# Patient Record
Sex: Female | Born: 1965 | Race: White | Hispanic: No | Marital: Single | Smoking: Never smoker
Health system: Southern US, Community
[De-identification: ages and names within clinical notes are randomized; demographics above are authoritative.]

## PROBLEM LIST (undated history)

## (undated) DIAGNOSIS — Z860100 Personal history of colon polyps, unspecified: Secondary | ICD-10-CM

## (undated) DIAGNOSIS — C569 Malignant neoplasm of unspecified ovary: Secondary | ICD-10-CM

## (undated) DIAGNOSIS — I10 Essential (primary) hypertension: Secondary | ICD-10-CM

## (undated) HISTORY — DX: Essential (primary) hypertension: I10

## (undated) HISTORY — PX: OTHER SURGICAL HISTORY: SHX169

## (undated) HISTORY — DX: Personal history of colonic polyps: Z86.010

## (undated) HISTORY — DX: Personal history of colon polyps, unspecified: Z86.0100

---

## 1993-08-15 DIAGNOSIS — C569 Malignant neoplasm of unspecified ovary: Secondary | ICD-10-CM

## 1993-08-15 HISTORY — DX: Malignant neoplasm of unspecified ovary: C56.9

## 1993-08-15 HISTORY — PX: ABDOMINAL HYSTERECTOMY: SHX81

## 2005-01-19 ENCOUNTER — Ambulatory Visit: Payer: Self-pay | Admitting: Oncology

## 2005-02-12 ENCOUNTER — Ambulatory Visit: Payer: Self-pay | Admitting: Oncology

## 2005-03-23 ENCOUNTER — Ambulatory Visit: Payer: Self-pay | Admitting: Oncology

## 2005-07-20 ENCOUNTER — Ambulatory Visit: Payer: Self-pay | Admitting: Oncology

## 2005-08-15 ENCOUNTER — Ambulatory Visit: Payer: Self-pay | Admitting: Oncology

## 2006-01-19 ENCOUNTER — Ambulatory Visit: Payer: Self-pay | Admitting: Oncology

## 2006-02-12 ENCOUNTER — Ambulatory Visit: Payer: Self-pay | Admitting: Oncology

## 2006-03-15 ENCOUNTER — Ambulatory Visit: Payer: Self-pay | Admitting: Oncology

## 2006-07-19 ENCOUNTER — Ambulatory Visit: Payer: Self-pay | Admitting: Oncology

## 2007-01-14 ENCOUNTER — Ambulatory Visit: Payer: Self-pay | Admitting: Oncology

## 2007-01-18 ENCOUNTER — Ambulatory Visit: Payer: Self-pay | Admitting: Oncology

## 2007-02-13 ENCOUNTER — Ambulatory Visit: Payer: Self-pay | Admitting: Oncology

## 2007-03-16 ENCOUNTER — Ambulatory Visit: Payer: Self-pay | Admitting: Oncology

## 2007-07-17 ENCOUNTER — Ambulatory Visit: Payer: Self-pay | Admitting: Oncology

## 2008-01-14 ENCOUNTER — Ambulatory Visit: Payer: Self-pay | Admitting: Oncology

## 2008-01-15 ENCOUNTER — Ambulatory Visit: Payer: Self-pay | Admitting: Oncology

## 2008-02-13 ENCOUNTER — Ambulatory Visit: Payer: Self-pay | Admitting: Oncology

## 2008-03-15 ENCOUNTER — Ambulatory Visit: Payer: Self-pay | Admitting: Oncology

## 2008-04-11 ENCOUNTER — Ambulatory Visit: Payer: Self-pay | Admitting: Gastroenterology

## 2008-07-15 ENCOUNTER — Ambulatory Visit: Payer: Self-pay | Admitting: Gynecologic Oncology

## 2008-07-15 ENCOUNTER — Ambulatory Visit: Payer: Self-pay | Admitting: Oncology

## 2009-01-13 ENCOUNTER — Ambulatory Visit: Payer: Self-pay | Admitting: Oncology

## 2009-01-15 ENCOUNTER — Ambulatory Visit: Payer: Self-pay | Admitting: Oncology

## 2009-02-12 ENCOUNTER — Ambulatory Visit: Payer: Self-pay | Admitting: Oncology

## 2009-03-15 ENCOUNTER — Ambulatory Visit: Payer: Self-pay | Admitting: Oncology

## 2009-07-15 ENCOUNTER — Ambulatory Visit: Payer: Self-pay | Admitting: Oncology

## 2009-07-28 ENCOUNTER — Ambulatory Visit: Payer: Self-pay | Admitting: Oncology

## 2009-08-15 ENCOUNTER — Ambulatory Visit: Payer: Self-pay | Admitting: Oncology

## 2010-01-13 ENCOUNTER — Ambulatory Visit: Payer: Self-pay | Admitting: Oncology

## 2010-01-14 ENCOUNTER — Ambulatory Visit: Payer: Self-pay | Admitting: Oncology

## 2010-02-12 ENCOUNTER — Ambulatory Visit: Payer: Self-pay | Admitting: Oncology

## 2010-03-15 ENCOUNTER — Ambulatory Visit: Payer: Self-pay | Admitting: Oncology

## 2010-07-27 ENCOUNTER — Ambulatory Visit: Payer: Self-pay | Admitting: Gynecologic Oncology

## 2010-08-15 ENCOUNTER — Ambulatory Visit: Payer: Self-pay | Admitting: Gynecologic Oncology

## 2011-01-21 ENCOUNTER — Ambulatory Visit: Payer: Self-pay | Admitting: Oncology

## 2011-02-13 ENCOUNTER — Ambulatory Visit: Payer: Self-pay | Admitting: Oncology

## 2011-03-16 ENCOUNTER — Ambulatory Visit: Payer: Self-pay | Admitting: Oncology

## 2011-08-02 ENCOUNTER — Ambulatory Visit: Payer: Self-pay | Admitting: Oncology

## 2011-08-16 ENCOUNTER — Ambulatory Visit: Payer: Self-pay | Admitting: Oncology

## 2012-05-10 ENCOUNTER — Ambulatory Visit: Payer: Self-pay | Admitting: Internal Medicine

## 2012-08-14 ENCOUNTER — Ambulatory Visit: Payer: Self-pay | Admitting: Gynecologic Oncology

## 2012-08-15 ENCOUNTER — Ambulatory Visit: Payer: Self-pay | Admitting: Gynecologic Oncology

## 2012-08-16 LAB — CA 125: CA 125: 16.3 U/mL (ref 0.0–34.0)

## 2013-05-10 ENCOUNTER — Ambulatory Visit: Payer: Self-pay | Admitting: Gastroenterology

## 2013-05-13 LAB — PATHOLOGY REPORT

## 2013-05-20 ENCOUNTER — Ambulatory Visit: Payer: Self-pay | Admitting: Internal Medicine

## 2013-08-20 ENCOUNTER — Ambulatory Visit: Payer: Self-pay | Admitting: Gynecologic Oncology

## 2013-08-21 LAB — CA 125: CA 125: 14.2 U/mL (ref 0.0–34.0)

## 2013-09-15 ENCOUNTER — Ambulatory Visit: Payer: Self-pay | Admitting: Gynecologic Oncology

## 2014-06-05 ENCOUNTER — Ambulatory Visit: Payer: Self-pay | Admitting: Internal Medicine

## 2014-09-24 ENCOUNTER — Ambulatory Visit: Payer: Self-pay | Admitting: Obstetrics and Gynecology

## 2014-10-14 ENCOUNTER — Ambulatory Visit
Admit: 2014-10-14 | Disposition: A | Discharge status: Home / Self Care | Payer: Self-pay | Attending: Obstetrics and Gynecology | Admitting: Obstetrics and Gynecology

## 2015-08-20 ENCOUNTER — Other Ambulatory Visit: Payer: Self-pay | Admitting: Internal Medicine

## 2015-08-20 DIAGNOSIS — Z1231 Encounter for screening mammogram for malignant neoplasm of breast: Secondary | ICD-10-CM

## 2015-09-10 ENCOUNTER — Ambulatory Visit
Admission: RE | Admit: 2015-09-10 | Discharge: 2015-09-10 | Disposition: A | Discharge status: Home / Self Care | Payer: Managed Care, Other (non HMO) | Source: Ambulatory Visit | Attending: Internal Medicine | Admitting: Internal Medicine

## 2015-09-10 DIAGNOSIS — Z1231 Encounter for screening mammogram for malignant neoplasm of breast: Secondary | ICD-10-CM | POA: Insufficient documentation

## 2015-09-10 HISTORY — DX: Malignant neoplasm of unspecified ovary: C56.9

## 2015-09-18 ENCOUNTER — Telehealth: Payer: Self-pay

## 2015-09-18 NOTE — Telephone Encounter (Signed)
  Oncology Nurse Navigator Documentation  Navigator Location: [facility] (09/18/15 1300)                                            Time Spent with Patient: 15 (09/18/15 1300)   Voicemail left for her to return call. Appt for 09/23/15 with Dr Fransisca Connors one year follow up needs to be changed due to him being in surgery.

## 2015-09-23 ENCOUNTER — Inpatient Hospital Stay: Payer: Managed Care, Other (non HMO)

## 2015-09-30 ENCOUNTER — Inpatient Hospital Stay: Payer: Managed Care, Other (non HMO)

## 2015-10-01 ENCOUNTER — Ambulatory Visit: Payer: Self-pay | Admitting: Physician Assistant

## 2015-10-01 ENCOUNTER — Encounter: Payer: Self-pay | Admitting: Physician Assistant

## 2015-10-01 VITALS — BP 119/80 | HR 97 | Temp 98.8°F

## 2015-10-01 DIAGNOSIS — J018 Other acute sinusitis: Secondary | ICD-10-CM

## 2015-10-01 MED ORDER — PREDNISONE 10 MG PO TABS: 30.0000 mg | ORAL_TABLET | Freq: Every day | ORAL | Status: DC

## 2015-10-01 MED ORDER — CEFDINIR 300 MG PO CAPS: 300.0000 mg | ORAL_CAPSULE | Freq: Two times a day (BID) | ORAL | Status: DC

## 2015-10-01 NOTE — Progress Notes (Signed)
S: C/o runny nose, ear pain,  and congestion for 3 days, no fever, chills, cp/sob, v/d; mucus is green and thick, cough is sporadic, c/o of facial and dental pain.   Using otc meds:   O: PE: vitals wnl, nad, perrl eomi, normocephalic, tms dull, nasal mucosa red and swollen, throat injected, neck supple no lymph, lungs c t a, cv rrr, neuro intact  A:  Acute sinusitis   P: omnicef, prednisone 30mg  qd x 3d,  drink fluids, continue regular meds , use otc meds of choice, return if not improving in 5 days, return earlier if worsening

## 2015-11-04 ENCOUNTER — Ambulatory Visit: Payer: Self-pay

## 2015-11-04 ENCOUNTER — Encounter: Payer: Self-pay | Admitting: Obstetrics and Gynecology

## 2015-11-04 ENCOUNTER — Inpatient Hospital Stay: Payer: Managed Care, Other (non HMO) | Attending: Obstetrics and Gynecology | Admitting: Obstetrics and Gynecology

## 2015-11-04 VITALS — BP 169/109 | HR 93 | Temp 97.9°F | Ht 61.0 in | Wt 186.5 lb

## 2015-11-04 DIAGNOSIS — Z803 Family history of malignant neoplasm of breast: Secondary | ICD-10-CM

## 2015-11-04 DIAGNOSIS — Z9221 Personal history of antineoplastic chemotherapy: Secondary | ICD-10-CM | POA: Diagnosis not present

## 2015-11-04 DIAGNOSIS — Z79899 Other long term (current) drug therapy: Secondary | ICD-10-CM | POA: Diagnosis not present

## 2015-11-04 DIAGNOSIS — I10 Essential (primary) hypertension: Secondary | ICD-10-CM | POA: Diagnosis not present

## 2015-11-04 DIAGNOSIS — C569 Malignant neoplasm of unspecified ovary: Secondary | ICD-10-CM | POA: Insufficient documentation

## 2015-11-04 DIAGNOSIS — Z9109 Other allergy status, other than to drugs and biological substances: Secondary | ICD-10-CM | POA: Insufficient documentation

## 2015-11-04 DIAGNOSIS — Z8543 Personal history of malignant neoplasm of ovary: Secondary | ICD-10-CM | POA: Diagnosis not present

## 2015-11-04 DIAGNOSIS — Z90710 Acquired absence of both cervix and uterus: Secondary | ICD-10-CM | POA: Diagnosis not present

## 2015-11-04 NOTE — Progress Notes (Signed)
Gynecologic Oncology Visit     Chief Concern: continued surveillance for h/o ovarian cancer  Subjective:  Latoya Lewis is a 50 y.o. female who is seen for surveillance for h/o ovarian cancer. She continues to do well. At her visit last year she had a normal CA125 of 17.3. She sees Dr. Ginette Pitman for her primary care needs and Dr. Ginette Pitman does her screening mammograms.    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 CA125=92. She underwent TRS followed by carboplatin and paclitaxel x 6.   05/1994 Second look surgery by Dr. Clarene Essex at [facility] was positive (peritoneum, nodes, LMP histology). She was treated with cisplatin and paclitaxel x 6 more cycles followed by one year of maintenance Hexelan.   She has been NED since.       Problem List: Patient Active Problem List   Diagnosis Date Noted  . Allergy to environmental factors 11/04/2015  . BP (high blood pressure) 11/04/2015  . Malignant neoplasm of ovary ([place]) 11/04/2015  . Ovarian cancer ([place]) 11/04/2015    Past Medical History: Past Medical History  Diagnosis Date  . Ovarian cancer ([place]) 1995    stage 3  . History of colonic polyps     s/p colonoscopy x two    Past Surgical History: Past Surgical History  Procedure Laterality Date  . Abdominal hysterectomy  1995    complete Hyst    Past Gynecologic History:  Menarche: 14  OB History:  OB History  Gravida Para Term Preterm AB SAB TAB Ectopic Multiple Living  0 0 0 0 0 0 0 0 0 0         Family History: Family History  Problem Relation Age of Onset  . Breast cancer Neg Hx   . Cancer Maternal Grandmother     stomach cancer  . Cancer Paternal Uncle     prostate cancer - 2 uncles > 62 y.o    Social History: Social  History   Social History  . Marital Status: Single    Spouse Name: N/A  . Number of Children: N/A  . Years of Education: N/A   Occupational History  . Not on file.   Social History Main Topics  . Smoking status: Unknown If Ever Smoked  . Smokeless tobacco: Not on file  . Alcohol Use: Not on file  . Drug Use: Not on file  . Sexual Activity: Not on file   Other Topics Concern  . Not on file   Social History Narrative    Allergies: Allergies  Allergen Reactions  . Contrast Media [Iodinated Diagnostic Agents]   . Fish Allergy Other (See Comments)    Current Medications: Current Outpatient Prescriptions  Medication Sig Dispense Refill  . Azelastine HCl 0.15 % SOLN INHALE 2 SPRAYS IN EACH NOSTRIL TWICE A DAY A NEEDED  7  . calcium-vitamin D (SM CALCIUM 500/VITAMIN D3) 500-400 MG-UNIT tablet     . hydrochlorothiazide (HYDRODIURIL) 25 MG tablet     . loratadine (CLARITIN) 10 MG tablet Take 10 mg by mouth daily as needed for allergies.    . montelukast (SINGULAIR) 10 MG tablet Take by mouth.    . Multiple Vitamin (MULTIVITAMIN) capsule Take by mouth.    . potassium chloride (K-DUR,KLOR-CON) 10 MEQ tablet  No current facility-administered medications for this visit.    Review of Systems General: no complaints  HEENT: no complaints  Lungs: no complaints  Cardiac: no complaints  GI: no complaints  GU: no complaints  Musculoskeletal: no complaints  Extremities: no complaints  Skin: no complaints  Neuro: no complaints  Endocrine: no complaints  Psych: no complaints      Objective:  Physical Examination:  BP 169/109 mmHg  Pulse 93  Temp(Src) 97.9 F (36.6 C) (Tympanic)  Ht 5\' 1"  (1.549 m)  Wt 186 lb 8.2 oz (84.6 kg)  BMI 35.26 kg/m2   ECOG Performance Status: 0 - Asymptomatic  General appearance: alert, cooperative and appears stated age HEENT:PERRLA, extra ocular movement intact and sclera clear, anicteric Lymph node survey: non-palpable, axillary,  inguinal, supraclavicular Cardiovascular: regular rate and rhythm Respiratory: normal air entry, lungs clear to auscultation Abdomen: soft, non-tender, without masses or organomegaly, no hernias and well healed incision Back: inspection of back is normal Extremities: extremities normal, atraumatic, no cyanosis or edema Skin exam - normal coloration and turgor, no rashes, no suspicious skin lesions noted. Neurological exam reveals alert, oriented, normal speech, no focal findings or movement disorder noted.  Pelvic: exam chaperoned by nurse;  Vulva: normal appearing vulva with no masses, tenderness or lesions; Vagina: normal vagina; Adnexa: no masses surgically absent bilateral; Uterus: surgically absent, vaginal cuff well healed; Cervix: absent; Rectal: not indicated    Lab Review Labs on site today: CA125 ordered  Radiologic Imaging: NA    Assessment:  Latoya Lewis is a 50 y.o. 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 Medical co-morbidities complicating care: HTN, obesity and prior abdominal surgery. Positive family history of cancer.  Plan:   Problem List Items Addressed This Visit      Genitourinary   Ovarian cancer ([place]) - Primary   Relevant Medications   loratadine (CLARITIN) 10 MG tablet   Other Relevant Orders   CA 125   CA 125      CA125 today. We discussed genetic testing given the uncertainty of her diagnosis and she will have testing completed today.   Suggested return to clinic in  1 year for continued surveillance.    She will follow up with Dr. Ginette Pitman for her primary care needs and screening studies.   Gillis Ends, MD

## 2015-11-04 NOTE — Progress Notes (Signed)
  Oncology Nurse Navigator Documentation  Navigator Location: [facility] (11/04/15 1300)                                            Time Spent with Patient: 30 (11/04/15 1300)   Chaperoned with pelvic exam

## 2015-11-10 ENCOUNTER — Telehealth: Payer: Self-pay | Admitting: *Deleted

## 2015-11-10 NOTE — Telephone Encounter (Signed)
Received notification from [facility] that pt needs to be referred to genetics counselor prior to proceeding with genetics testing. Left message for patient with this information and instructed to callback if would like to meet with genetics counselor in  and set her up for counseling over the phone. Awaiting callback.

## 2015-11-11 NOTE — Telephone Encounter (Signed)
Pt called back to inform our office that would like to be set up with telephonic genetic counselor. Called pt back to request email address to put on referral form for [employer] to reach out to pt to set up account with genetic counselor. Referral faxed to [facility] to further assist pt.

## 2015-11-27 ENCOUNTER — Encounter: Payer: Self-pay | Admitting: Oncology

## 2015-12-11 ENCOUNTER — Encounter: Payer: Self-pay | Admitting: Oncology

## 2016-05-20 DIAGNOSIS — J309 Allergic rhinitis, unspecified: Secondary | ICD-10-CM | POA: Insufficient documentation

## 2016-08-23 ENCOUNTER — Other Ambulatory Visit: Payer: Self-pay | Admitting: Internal Medicine

## 2016-08-23 DIAGNOSIS — Z1231 Encounter for screening mammogram for malignant neoplasm of breast: Secondary | ICD-10-CM

## 2016-09-16 ENCOUNTER — Ambulatory Visit
Admission: RE | Admit: 2016-09-16 | Discharge: 2016-09-16 | Disposition: A | Discharge status: Home / Self Care | Payer: Managed Care, Other (non HMO) | Source: Ambulatory Visit | Attending: Internal Medicine | Admitting: Internal Medicine

## 2016-09-16 DIAGNOSIS — Z1231 Encounter for screening mammogram for malignant neoplasm of breast: Secondary | ICD-10-CM | POA: Diagnosis present

## 2016-11-02 ENCOUNTER — Inpatient Hospital Stay: Payer: Managed Care, Other (non HMO) | Attending: Obstetrics and Gynecology

## 2016-11-02 ENCOUNTER — Encounter: Payer: Self-pay | Admitting: Obstetrics and Gynecology

## 2016-11-02 ENCOUNTER — Inpatient Hospital Stay (HOSPITAL_BASED_OUTPATIENT_CLINIC_OR_DEPARTMENT_OTHER): Payer: Managed Care, Other (non HMO) | Admitting: Obstetrics and Gynecology

## 2016-11-02 VITALS — BP 160/98 | HR 83 | Temp 97.4°F | Resp 18 | Ht 61.0 in | Wt 187.6 lb

## 2016-11-02 DIAGNOSIS — Z90722 Acquired absence of ovaries, bilateral: Secondary | ICD-10-CM | POA: Insufficient documentation

## 2016-11-02 DIAGNOSIS — Z79899 Other long term (current) drug therapy: Secondary | ICD-10-CM | POA: Insufficient documentation

## 2016-11-02 DIAGNOSIS — I10 Essential (primary) hypertension: Secondary | ICD-10-CM | POA: Insufficient documentation

## 2016-11-02 DIAGNOSIS — Z8543 Personal history of malignant neoplasm of ovary: Secondary | ICD-10-CM

## 2016-11-02 DIAGNOSIS — Z90710 Acquired absence of both cervix and uterus: Secondary | ICD-10-CM | POA: Diagnosis not present

## 2016-11-02 DIAGNOSIS — C569 Malignant neoplasm of unspecified ovary: Secondary | ICD-10-CM

## 2016-11-02 DIAGNOSIS — Z9221 Personal history of antineoplastic chemotherapy: Secondary | ICD-10-CM | POA: Insufficient documentation

## 2016-11-02 NOTE — Progress Notes (Signed)
  Oncology Nurse Navigator Documentation Chaperoned pelvic exam. Follow up in one year with Ca 125 Navigator Location: [facility] (11/02/16 1300)   )Navigator Encounter Type: Follow-up Appt (11/02/16 1300)                     Patient Visit Type: GynOnc (11/02/16 1300)                              Time Spent with Patient: 15 (11/02/16 1300)

## 2016-11-02 NOTE — Progress Notes (Signed)
Gynecologic Oncology Visit     Chief Concern: continued surveillance for h/o ovarian cancer  Subjective:  Latoya Lewis is a 51 y.o. female who is seen for surveillance for h/o ovarian cancer. She continues to do well. At her visit last year she had a normal CA125 of 17.3. She sees Dr. Ginette Pitman for her primary care needs and Dr. Ginette Pitman does her screening mammograms.    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 CA125=92. She underwent TRS followed by carboplatin and paclitaxel x 6.   05/1994 Second look surgery by Dr. Clarene Essex at [facility] was positive (peritoneum, nodes, LMP histology). She was treated with cisplatin and paclitaxel x 6 more cycles followed by one year of maintenance Hexelan.   She has been NED since.       Problem List: Patient Active Problem List   Diagnosis Date Noted  . Allergy to environmental factors 11/04/2015  . BP (high blood pressure) 11/04/2015  . Malignant neoplasm of ovary ([place]) 11/04/2015  . Ovarian cancer ([place]) 11/04/2015    Past Medical History: Past Medical History:  Diagnosis Date  . History of colonic polyps    s/p colonoscopy x two  . Hypertension   . Ovarian cancer ([place]) 1995   stage 3    Past Surgical History: Past Surgical History:  Procedure Laterality Date  . ABDOMINAL HYSTERECTOMY  1995   complete Hyst  . exploratoy hysteroscopy     1995    Past Gynecologic History:  Menarche: 14  OB History:  OB History  Gravida Para Term Preterm AB Living  0 0 0 0 0 0  SAB TAB Ectopic Multiple Live Births  0 0 0 0          Family History: Family History  Problem Relation Age of Onset  . Cancer Maternal Grandmother     stomach cancer  . Cancer Paternal Uncle     prostate cancer - 2  uncles > 16 y.o  . Colon cancer Paternal Uncle     wtih mets  . Breast cancer Neg Hx     Social History: Social History   Social History  . Marital status: Single    Spouse name: N/A  . Number of children: N/A  . Years of education: N/A   Occupational History  . Not on file.   Social History Main Topics  . Smoking status: Never Smoker  . Smokeless tobacco: Never Used  . Alcohol use No  . Drug use: No  . Sexual activity: No   Other Topics Concern  . Not on file   Social History Narrative  . No narrative on file    Allergies: Allergies  Allergen Reactions  . Contrast Media [Iodinated Diagnostic Agents]   . Fish Allergy Other (See Comments)    Current Medications: Current Outpatient Prescriptions  Medication Sig Dispense Refill  . Azelastine HCl 0.15 % SOLN INHALE 2 SPRAYS IN EACH NOSTRIL TWICE A DAY A NEEDED  7  . calcium-vitamin D (SM CALCIUM 500/VITAMIN D3) 500-400 MG-UNIT tablet 1 tablet daily.     . hydrochlorothiazide (HYDRODIURIL) 25 MG tablet 25 mg daily.     Marland Kitchen loratadine (CLARITIN) 10 MG tablet Take 10 mg by mouth daily as needed for allergies.    Marland Kitchen  montelukast (SINGULAIR) 10 MG tablet Take 10 mg by mouth daily.     . Multiple Vitamin (MULTIVITAMIN) capsule Take by mouth.    . potassium chloride (K-DUR,KLOR-CON) 10 MEQ tablet 10 mEq daily.     . Triamcinolone Acetonide (NASACORT AQ NA) Place 1 spray into the nose daily.     No current facility-administered medications for this visit.     Review of Systems General: no complaints  HEENT: no complaints  Lungs: no complaints  Cardiac: no complaints  GI: no complaints  GU: no complaints  Musculoskeletal: no complaints  Extremities: no complaints  Skin: no complaints  Neuro: no complaints  Endocrine: no complaints  Psych: no complaints      Objective:  Physical Examination:  BP (!) 160/98   Pulse 83   Temp 97.4 F (36.3 C) (Tympanic)   Resp 18   Ht 5\' 1"  (1.549 m)   Wt 187 lb 9.6 oz (85.1  kg)   BMI 35.45 kg/m    ECOG Performance Status: 0 - Asymptomatic  General appearance: alert, cooperative and appears stated age HEENT:PERRLA, extra ocular movement intact and sclera clear, anicteric Lymph node survey: non-palpable, axillary, inguinal, supraclavicular Cardiovascular: regular rate and rhythm Respiratory: normal air entry, lungs clear to auscultation Abdomen: soft, non-tender, without masses or organomegaly, no hernias and well healed incision Back: inspection of back is normal Extremities: extremities normal, atraumatic, no cyanosis or edema Skin exam - normal coloration and turgor, no rashes, no suspicious skin lesions noted. Neurological exam reveals alert, oriented, normal speech, no focal findings or movement disorder noted.  Pelvic: exam chaperoned by nurse;  Vulva: normal appearing vulva with no masses, tenderness or lesions; Vagina: normal vagina; Adnexa: no masses surgically absent bilateral; Uterus: surgically absent, vaginal cuff well healed; Cervix: absent; Rectal: not indicated    Lab Review Labs on site today: CA125 ordered  Radiologic Imaging: NA    Assessment:  Latoya Lewis is a 51 y.o. 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 Medical co-morbidities complicating care: HTN, obesity and prior abdominal surgery. Positive family history of cancer.  Plan:   Problem List Items Addressed This Visit    None      CA125 today. We discussed genetic testing given the uncertainty of her diagnosis and she will have testing completed today.   Suggested return to clinic in  1 year for continued surveillance.    She will follow up with Dr. Ginette Pitman for her primary care needs and screening studies.   Gillis Ends, MD

## 2016-11-02 NOTE — Progress Notes (Signed)
No gyn concerns 

## 2016-11-03 ENCOUNTER — Telehealth: Payer: Self-pay

## 2016-11-03 LAB — CA 125: CA 125: 17.8 U/mL (ref 0.0–38.1)

## 2016-11-03 NOTE — Telephone Encounter (Signed)
  Oncology Nurse Navigator Documentation Notified of CA-125 of 17.8 Navigator Location: [facility] (11/03/16 0900)   )Navigator Encounter Type: Diagnostic Results;Telephone (11/03/16 0900)                                                    Time Spent with Patient: 15 (11/03/16 0900)

## 2016-11-30 ENCOUNTER — Encounter: Payer: Self-pay | Admitting: Obstetrics and Gynecology

## 2017-08-22 ENCOUNTER — Other Ambulatory Visit: Payer: Self-pay | Admitting: Internal Medicine

## 2017-08-22 DIAGNOSIS — Z1231 Encounter for screening mammogram for malignant neoplasm of breast: Secondary | ICD-10-CM

## 2017-09-19 ENCOUNTER — Ambulatory Visit
Admission: RE | Admit: 2017-09-19 | Discharge: 2017-09-19 | Disposition: A | Discharge status: Home / Self Care | Payer: Managed Care, Other (non HMO) | Source: Ambulatory Visit | Attending: Internal Medicine | Admitting: Internal Medicine

## 2017-09-19 DIAGNOSIS — Z1231 Encounter for screening mammogram for malignant neoplasm of breast: Secondary | ICD-10-CM | POA: Diagnosis present

## 2017-11-01 ENCOUNTER — Inpatient Hospital Stay: Payer: Managed Care, Other (non HMO) | Attending: Obstetrics and Gynecology | Admitting: Obstetrics and Gynecology

## 2017-11-01 ENCOUNTER — Inpatient Hospital Stay: Payer: Managed Care, Other (non HMO)

## 2017-11-01 VITALS — BP 153/101 | Temp 97.5°F | Resp 18 | Ht 61.0 in | Wt 181.2 lb

## 2017-11-01 DIAGNOSIS — Z9221 Personal history of antineoplastic chemotherapy: Secondary | ICD-10-CM | POA: Diagnosis not present

## 2017-11-01 DIAGNOSIS — I10 Essential (primary) hypertension: Secondary | ICD-10-CM | POA: Insufficient documentation

## 2017-11-01 DIAGNOSIS — C569 Malignant neoplasm of unspecified ovary: Secondary | ICD-10-CM

## 2017-11-01 DIAGNOSIS — Z8543 Personal history of malignant neoplasm of ovary: Secondary | ICD-10-CM | POA: Diagnosis not present

## 2017-11-01 NOTE — Progress Notes (Signed)
No Gyn changes noted today 

## 2017-11-01 NOTE — Progress Notes (Addendum)
Gynecologic Oncology Visit     Chief Concern: continued surveillance for h/o ovarian cancer  Subjective:  Latoya Lewis is a 52 y.o. female who is seen for surveillance for h/o ovarian cancer. She continues to do well. At her visit last year she had a normal CA125 of 17.8. She sees Dr. Ginette Pitman for her primary care needs and Dr. Ginette Pitman does her screening mammograms.    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 CA125=92. She underwent TRS followed by carboplatin and paclitaxel x 6.   05/1994 Second look surgery by Dr. Clarene Essex at [facility] was positive (peritoneum, nodes, LMP histology). She was treated with cisplatin and paclitaxel x 6 more cycles followed by one year of maintenance Hexelan.   She has been NED since.     Genetic testing: Negative based on [facility]  [facility] testing was negative for BRCA1/2 or other mutations in APC, ATM, BARD1, BMPR1A, BRIP1, CDH1, CDK4, CDKN2A, CHEK2, EPCAM (large rearrangement only), MLH1, MSH2, MSH6, MUTYH, NBN, PALB2, PMS2, PTEN, RAD51C, RAD51D, SMAD4, STK11, TP53. POLE, POLD1, and GREM1 sequencing in select regions also negative.   Problem List: Patient Active Problem List   Diagnosis Date Noted  . Allergy to environmental factors 11/04/2015  . BP (high blood pressure) 11/04/2015  . Malignant neoplasm of ovary ([place]) 11/04/2015  . Ovarian cancer ([place]) 11/04/2015    Past Medical History: Past Medical History:  Diagnosis Date  . History of colonic polyps    s/p colonoscopy x two  . Hypertension   . Ovarian cancer ([place]) 1995   stage 3    Past Surgical History: Past Surgical History:  Procedure Laterality Date  . ABDOMINAL HYSTERECTOMY  1995   complete Hyst  . exploratoy hysteroscopy      1995    Past Gynecologic History:  Menarche: 14  OB History:  OB History  Gravida Para Term Preterm AB Living  0 0 0 0 0 0  SAB TAB Ectopic Multiple Live Births  0 0 0 0          Family History: Family History  Problem Relation Age of Onset  . Cancer Maternal Grandmother        stomach cancer  . Cancer Paternal Uncle        prostate cancer - 2 uncles > 82 y.o  . Colon cancer Paternal Uncle        wtih mets  . Breast cancer Neg Hx     Social History: Social History   Socioeconomic History  . Marital status: Single    Spouse name: Not on file  . Number of children: Not on file  . Years of education: Not on file  . Highest education level: Not on file  Social Needs  . Financial resource strain: Not on file  . Food insecurity - worry: Not on file  . Food insecurity - inability: Not on file  . Transportation needs - medical: Not on file  . Transportation needs - non-medical: Not on file  Occupational History  . Not on file  Tobacco Use  . Smoking status: Never Smoker  . Smokeless tobacco: Never Used  Substance and Sexual Activity  . Alcohol use: No    Alcohol/week: 0.0 oz  . Drug use: No  .  Sexual activity: No  Other Topics Concern  . Not on file  Social History Narrative  . Not on file    Allergies: Allergies  Allergen Reactions  . Pollen Extract Anaphylaxis  . Contrast Media [Iodinated Diagnostic Agents]   . Fish Allergy Other (See Comments)    Current Medications: Current Outpatient Medications  Medication Sig Dispense Refill  . Azelastine HCl 0.15 % SOLN INHALE 2 SPRAYS IN EACH NOSTRIL TWICE A DAY A NEEDED  7  . calcium-vitamin D (SM CALCIUM 500/VITAMIN D3) 500-400 MG-UNIT tablet 1 tablet daily.     . hydrochlorothiazide (HYDRODIURIL) 25 MG tablet 25 mg daily.     Marland Kitchen loratadine (CLARITIN) 10 MG tablet Take 10 mg by mouth daily as needed for allergies.    . montelukast (SINGULAIR) 10 MG tablet Take 10 mg by mouth daily.     . Multiple Vitamin  (MULTIVITAMIN) capsule Take by mouth.    . potassium chloride (K-DUR,KLOR-CON) 10 MEQ tablet 10 mEq daily.     . Triamcinolone Acetonide (NASACORT AQ NA) Place 1 spray into the nose daily.     No current facility-administered medications for this visit.     Review of Systems General: no complaints  HEENT: no complaints  Lungs: no complaints  Cardiac: no complaints  GI: no complaints  GU: no complaints  Musculoskeletal: no complaints  Extremities: no complaints  Skin: no complaints  Neuro: no complaints  Endocrine: no complaints  Psych: no complaints      Objective:  Physical Examination:  BP (!) 180/130 (BP Location: Right Arm, Patient Position: Sitting)   Temp (!) 97.5 F (36.4 C) (Tympanic)   Resp 18   Ht 5' 1" (1.549 m)   Wt 181 lb 3.2 oz (82.2 kg)   BMI 34.24 kg/m    BP repeat 171/113, 152/103, and 152/96   ECOG Performance Status: 0 - Asymptomatic  General appearance: alert, cooperative and appears stated age HEENT:PERRLA, extra ocular movement intact and sclera clear, anicteric Lymph node survey: non-palpable, axillary, inguinal, supraclavicular Cardiovascular: regular rate and rhythm Respiratory: normal air entry, lungs clear to auscultation Abdomen: soft, non-tender, without masses or organomegaly, no hernias and well healed incision Back: inspection of back is normal Extremities: extremities normal, atraumatic, no cyanosis or edema Skin exam - normal coloration and turgor, no rashes, no suspicious skin lesions noted. Neurological exam reveals alert, oriented, normal speech, no focal findings or movement disorder noted.  Pelvic: exam chaperoned by nurse;  Vulva: normal appearing vulva with no masses, tenderness or lesions; Vagina: normal vagina, very narrow needed to use narrow speculum; Adnexa: no masses surgically absent bilateral but limited exam; Uterus: surgically absent, vaginal cuff well healed; Cervix: absent; Rectal: confirmatory    Lab  Review Labs on site today: CA125 ordered  Radiologic Imaging: NA    Assessment:  Latoya Lewis is a 53 y.o. 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 Clinically NED.   Hypertension, poorly controlled, asymptomatic.   Medical co-morbidities complicating care: HTN, obesity and prior abdominal surgery. Positive family history of cancer.  Plan:   Problem List Items Addressed This Visit      Genitourinary   Malignant neoplasm of ovary ([place]) - Primary   Relevant Orders   CA 125      CA125 today.   HTN concerning. She is asymptomatic. Stroke precautions were given. Our team will contact [facility]. She will recheck her pressures at work today. She works at  [employer]. She is very knowledgeable and compliant with care. She states her BP also increases at doctor visits but at home it is running 130/80's.   Suggested return to clinic in  1 year for continued surveillance.    She will follow up with Dr. Ginette Pitman for her primary care needs and screening studies.  I personally reviewed the patient's history, completed key elements of her exam, and was involved in decision making in conjunction with Ms. Beckey Rutter.   Gillis Ends, MD

## 2017-11-01 NOTE — Progress Notes (Addendum)
Entered in error

## 2017-11-02 ENCOUNTER — Telehealth: Payer: Self-pay | Admitting: Nurse Practitioner

## 2017-11-02 LAB — CA 125: Cancer Antigen (CA) 125: 17.2 U/mL (ref 0.0–38.1)

## 2017-11-02 NOTE — Telephone Encounter (Signed)
Called patient to share results of CA125. Left message to return call.

## 2018-08-14 ENCOUNTER — Other Ambulatory Visit: Payer: Self-pay | Admitting: Internal Medicine

## 2018-08-14 DIAGNOSIS — Z1231 Encounter for screening mammogram for malignant neoplasm of breast: Secondary | ICD-10-CM

## 2018-09-20 ENCOUNTER — Ambulatory Visit
Admission: RE | Admit: 2018-09-20 | Discharge: 2018-09-20 | Disposition: A | Discharge status: Home / Self Care | Payer: Managed Care, Other (non HMO) | Source: Ambulatory Visit | Attending: Internal Medicine | Admitting: Internal Medicine

## 2018-09-20 DIAGNOSIS — Z1231 Encounter for screening mammogram for malignant neoplasm of breast: Secondary | ICD-10-CM | POA: Insufficient documentation

## 2018-10-29 ENCOUNTER — Telehealth: Payer: Self-pay | Admitting: Nurse Practitioner

## 2018-10-29 NOTE — Progress Notes (Deleted)
Gynecologic Oncology Visit     Chief Concern: continued surveillance for h/o ovarian cancer  Subjective:  Latoya Lewis is a 53 y.o. female who is seen for surveillance for h/o ovarian cancer. She continues to do well. At her visit last year she had a normal CA125 of 17.8. She sees Dr. Ginette Pitman for her primary care needs and Dr. Ginette Pitman does her screening mammograms.    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 Her history is as follows:   10/1993 at the age of 67 she had adenocarcinoma of the ovary, low grade invasive vs LMP. CA125=92. She underwent TRS followed by carboplatin and paclitaxel x 6.   05/1994 Second look surgery by Dr. Clarene Essex at [facility] was positive (peritoneum, nodes, LMP histology). She was treated with cisplatin and paclitaxel x 6 more cycles followed by one year of maintenance Hexelan.   She has been NED since.     Genetic testing: Negative based on [facility]  Myrisk testing was negative for BRCA1/2 or other mutations in APC, ATM, BARD1, BMPR1A, BRIP1, CDH1, CDK4, CDKN2A, CHEK2, EPCAM (large rearrangement only), MLH1, MSH2, MSH6, MUTYH, NBN, PALB2, PMS2, PTEN, RAD51C, RAD51D, SMAD4, STK11, TP53. POLE, POLD1, and GREM1 sequencing in select regions also negative.   Problem List: Patient Active Problem List   Diagnosis Date Noted  . Allergy to environmental factors 11/04/2015  . BP (high blood pressure) 11/04/2015  . Malignant neoplasm of ovary ([place]) 11/04/2015  . Ovarian cancer ([place]) 11/04/2015    Past Medical History: Past Medical History:  Diagnosis Date  . History of colonic polyps    s/p colonoscopy x two  . Hypertension   . Ovarian cancer ([place]) 1995   stage 3    Past Surgical History: Past Surgical History:  Procedure Laterality Date  . ABDOMINAL HYSTERECTOMY  1995   complete Hyst  . exploratoy hysteroscopy      1995    Past Gynecologic History:  Menarche: 14  OB History:  OB History  Gravida Para Term Preterm AB Living  0 0 0 0 0 0  SAB TAB Ectopic Multiple Live Births  0 0 0 0      Family History: Family History  Problem Relation Age of Onset  . Cancer Maternal Grandmother        stomach cancer  . Cancer Paternal Uncle        prostate cancer - 2 uncles > 15 y.o  . Colon cancer Paternal Uncle        wtih mets  . Breast cancer Neg Hx     Social History: Social History   Socioeconomic History  . Marital status: Single    Spouse name: Not on file  . Number of children: Not on file  . Years of education: Not on file  . Highest education level: Not on file  Occupational History  . Not on file  Social Needs  . Financial resource strain: Not on file  . Food insecurity:    Worry: Not on file    Inability: Not on file  . Transportation needs:    Medical: Not on file    Non-medical: Not on file  Tobacco Use  . Smoking status: Never Smoker  . Smokeless tobacco: Never Used  Substance and Sexual Activity  . Alcohol use: No    Alcohol/week: 0.0 standard drinks  . Drug use: No  . Sexual  activity: Never  Lifestyle  . Physical activity:    Days per week: Not on file    Minutes per session: Not on file  . Stress: Not on file  Relationships  . Social connections:    Talks on phone: Not on file    Gets together: Not on file    Attends religious service: Not on file    Active member of club or organization: Not on file    Attends meetings of clubs or organizations: Not on file    Relationship status: Not on file  . Intimate partner violence:    Fear of current or ex partner: Not on file    Emotionally abused: Not on file    Physically abused: Not on file    Forced sexual activity: Not on file  Other Topics Concern  . Not on file  Social History Narrative  . Not on file    Allergies: Allergies  Allergen Reactions  . Pollen Extract Anaphylaxis  . Contrast Media  [Iodinated Diagnostic Agents]   . Fish Allergy Other (See Comments)    Current Medications: Current Outpatient Medications  Medication Sig Dispense Refill  . Azelastine HCl 0.15 % SOLN INHALE 2 SPRAYS IN EACH NOSTRIL TWICE A DAY A NEEDED  7  . calcium-vitamin D (SM CALCIUM 500/VITAMIN D3) 500-400 MG-UNIT tablet 1 tablet daily.     . hydrochlorothiazide (HYDRODIURIL) 25 MG tablet 25 mg daily.     Marland Kitchen loratadine (CLARITIN) 10 MG tablet Take 10 mg by mouth daily as needed for allergies.    . montelukast (SINGULAIR) 10 MG tablet Take 10 mg by mouth daily.     . Multiple Vitamin (MULTIVITAMIN) capsule Take by mouth.    . potassium chloride (K-DUR,KLOR-CON) 10 MEQ tablet 10 mEq daily.     . Triamcinolone Acetonide (NASACORT AQ NA) Place 1 spray into the nose daily.     No current facility-administered medications for this visit.     Review of Systems General: no complaints  HEENT: no complaints  Lungs: no complaints  Cardiac: no complaints  GI: no complaints  GU: no complaints  Musculoskeletal: no complaints  Extremities: no complaints  Skin: no complaints  Neuro: no complaints  Endocrine: no complaints  Psych: no complaints      Objective:  Physical Examination:  There were no vitals taken for this visit.   BP repeat 171/113, 152/103, and 152/96   ECOG Performance Status: 0 - Asymptomatic  General appearance: alert, cooperative and appears stated age HEENT:PERRLA, extra ocular movement intact and sclera clear, anicteric Lymph node survey: non-palpable, axillary, inguinal, supraclavicular Cardiovascular: regular rate and rhythm Respiratory: normal air entry, lungs clear to auscultation Abdomen: soft, non-tender, without masses or organomegaly, no hernias and well healed incision Back: inspection of back is normal Extremities: extremities normal, atraumatic, no cyanosis or edema Skin exam - normal coloration and turgor, no rashes, no suspicious skin lesions  noted. Neurological exam reveals alert, oriented, normal speech, no focal findings or movement disorder noted.  Pelvic: exam chaperoned by nurse;  Vulva: normal appearing vulva with no masses, tenderness or lesions; Vagina: normal vagina, very narrow needed to use narrow speculum; Adnexa: no masses surgically absent bilateral but limited exam; Uterus: surgically absent, vaginal cuff well healed; Cervix: absent; Rectal: confirmatory    Lab Review Labs on site today: CA125 ordered  Radiologic Imaging: NA    Assessment:  Latoya Lewis is a 53 y.o. female diagnosed with Stage III epithelial ovarian cancer - low malignant  potential/borderline vs invasive ovarian cancer. Clinically NED.   Hypertension, poorly controlled, asymptomatic.   Medical co-morbidities complicating care: HTN, obesity and prior abdominal surgery. Positive family history of cancer.  Plan:   Problem List Items Addressed This Visit    None      CA125 today.   HTN concerning. She is asymptomatic. Stroke precautions were given. Our team will contact [facility]. She will recheck her pressures at work today. She works at [employer]. She is very knowledgeable and compliant with care. She states her BP also increases at doctor visits but at home it is running 130/80's.   Suggested return to clinic in  1 year for continued surveillance.    She will follow up with Dr. Ginette Pitman for her primary care needs and screening studies.  I personally reviewed the patient's history, completed key elements of her exam, and was involved in decision making in conjunction with Ms. Beckey Rutter.   Gillis Ends, MD

## 2018-10-29 NOTE — Telephone Encounter (Signed)
Called patient to attempt to reschedule appointment with Dr. Theora Gianotti for 10/31/2018. Case discussed with Dr. Theora Gianotti who recommends rescheduling in 4-8 weeks given that she is currently on annual surveillance.

## 2018-10-31 ENCOUNTER — Inpatient Hospital Stay: Payer: Managed Care, Other (non HMO)

## 2018-12-26 ENCOUNTER — Inpatient Hospital Stay: Payer: Managed Care, Other (non HMO)

## 2019-01-29 ENCOUNTER — Telehealth: Payer: Self-pay

## 2019-01-29 NOTE — Telephone Encounter (Signed)
Voicemail left with Latoya Lewis to return call to reschedule appointment from March that was cancelled due to COVID-19 pandemic.

## 2019-03-20 ENCOUNTER — Other Ambulatory Visit: Payer: Self-pay

## 2019-03-20 ENCOUNTER — Inpatient Hospital Stay: Payer: Managed Care, Other (non HMO) | Attending: Obstetrics and Gynecology | Admitting: Obstetrics and Gynecology

## 2019-03-20 ENCOUNTER — Encounter: Payer: Self-pay | Admitting: Obstetrics and Gynecology

## 2019-03-20 ENCOUNTER — Inpatient Hospital Stay: Payer: Managed Care, Other (non HMO)

## 2019-03-20 VITALS — BP 151/91 | HR 85 | Temp 97.7°F | Resp 20 | Ht 61.0 in | Wt 186.6 lb

## 2019-03-20 DIAGNOSIS — I10 Essential (primary) hypertension: Secondary | ICD-10-CM | POA: Diagnosis not present

## 2019-03-20 DIAGNOSIS — E669 Obesity, unspecified: Secondary | ICD-10-CM | POA: Diagnosis not present

## 2019-03-20 DIAGNOSIS — Z8543 Personal history of malignant neoplasm of ovary: Secondary | ICD-10-CM | POA: Diagnosis not present

## 2019-03-20 DIAGNOSIS — Z79899 Other long term (current) drug therapy: Secondary | ICD-10-CM | POA: Insufficient documentation

## 2019-03-20 DIAGNOSIS — C569 Malignant neoplasm of unspecified ovary: Secondary | ICD-10-CM

## 2019-03-20 DIAGNOSIS — Z90710 Acquired absence of both cervix and uterus: Secondary | ICD-10-CM | POA: Diagnosis not present

## 2019-03-20 NOTE — Progress Notes (Signed)
Gynecologic Oncology Progress Visit   Chief Concern: continued surveillance for h/o ovarian cancer  Subjective:  Latoya Lewis is a 53 y.o. female who is seen for surveillance for h/o ovarian cancer. She continues to do and feel well. At her visit last year she had a normal CA-125 of 17.2. She sees Dr. Ginette Pitman for her primary care needs.   Last mammogram on 09/20/2018 was BI-RADS Category 1: Negative.  Colonoscopy on 05/07/2018 consistent with diverticulosis.  Recommendation to repeat in 04/2023.  Today, she reports that she feels well and denies specific complaints.  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 Her history is as follows:   10/1993 at the age of 61 she had adenocarcinoma of the ovary, low grade invasive vs LMP. CA125=92. She underwent TRS followed by carboplatin and paclitaxel x 6.   05/1994 Second look surgery by Dr. Clarene Essex at [facility] was positive (peritoneum, nodes, LMP histology). She was treated with cisplatin and paclitaxel x 6 more cycles followed by one year of maintenance Hexelan.   She has been NED since.     Genetic testing: Negative based on [facility]  Myrisk testing was negative for BRCA1/2 or other mutations in APC, ATM, BARD1, BMPR1A, BRIP1, CDH1, CDK4, CDKN2A, CHEK2, EPCAM (large rearrangement only), MLH1, MSH2, MSH6, MUTYH, NBN, PALB2, PMS2, PTEN, RAD51C, RAD51D, SMAD4, STK11, TP53. POLE, POLD1, and GREM1 sequencing in select regions also negative.   Problem List: Patient Active Problem List   Diagnosis Date Noted  . Allergy to environmental factors 11/04/2015  . BP (high blood pressure) 11/04/2015  . Malignant neoplasm of ovary ([place]) 11/04/2015  . Ovarian cancer ([place]) 11/04/2015    Past Medical History: Past Medical History:  Diagnosis Date  . History  of colonic polyps    s/p colonoscopy x two  . Hypertension   . Ovarian cancer ([place]) 1995   stage 3    Past Surgical History: Past Surgical History:  Procedure Laterality Date  . ABDOMINAL HYSTERECTOMY  1995   complete Hyst  . exploratoy hysteroscopy     1995    Past Gynecologic History:  Menarche: 14  OB History:  OB History  Gravida Para Term Preterm AB Living  0 0 0 0 0 0  SAB TAB Ectopic Multiple Live Births  0 0 0 0      Family History: Family History  Problem Relation Age of Onset  . Cancer Maternal Grandmother        stomach cancer  . Cancer Paternal Uncle        prostate cancer - 2 uncles > 26 y.o  . Colon cancer Paternal Uncle        wtih mets  . Breast cancer Neg Hx     Social History: Social History   Socioeconomic History  . Marital status: Single    Spouse name: Not on file  . Number of children: Not on file  . Years of education: Not on file  . Highest education level: Not on file  Occupational History  . Not on file  Social Needs  . Financial resource strain: Not on file  . Food insecurity    Worry: Not on file    Inability: Not on file  . Transportation needs    Medical: Not on file    Non-medical:  Not on file  Tobacco Use  . Smoking status: Never Smoker  . Smokeless tobacco: Never Used  Substance and Sexual Activity  . Alcohol use: No    Alcohol/week: 0.0 standard drinks  . Drug use: No  . Sexual activity: Never  Lifestyle  . Physical activity    Days per week: Not on file    Minutes per session: Not on file  . Stress: Not on file  Relationships  . Social Herbalist on phone: Not on file    Gets together: Not on file    Attends religious service: Not on file    Active member of club or organization: Not on file    Attends meetings of clubs or organizations: Not on file    Relationship status: Not on file  . Intimate partner violence    Fear of current or ex partner: Not on file    Emotionally abused: Not on  file    Physically abused: Not on file    Forced sexual activity: Not on file  Other Topics Concern  . Not on file  Social History Narrative  . Not on file    Allergies: Allergies  Allergen Reactions  . Pollen Extract Anaphylaxis  . Contrast Media [Iodinated Diagnostic Agents]   . Fish Allergy Other (See Comments)    Current Medications: Current Outpatient Medications  Medication Sig Dispense Refill  . Azelastine HCl 0.15 % SOLN INHALE 2 SPRAYS IN EACH NOSTRIL TWICE A DAY A NEEDED  7  . calcium-vitamin D (SM CALCIUM 500/VITAMIN D3) 500-400 MG-UNIT tablet 1 tablet daily.     Marland Kitchen loratadine (CLARITIN) 10 MG tablet Take 10 mg by mouth daily as needed for allergies.    Marland Kitchen losartan-hydrochlorothiazide (HYZAAR) 50-12.5 MG tablet Take 1 tablet by mouth daily.    . montelukast (SINGULAIR) 10 MG tablet Take 10 mg by mouth daily.     . Multiple Vitamin (MULTIVITAMIN) capsule Take by mouth.    . Triamcinolone Acetonide (NASACORT AQ NA) Place 1 spray into the nose daily.     No current facility-administered medications for this visit.    Review of Systems General:  no complaints Skin: no complaints Eyes: no complaints HEENT: no complaints Breasts: no complaints Pulmonary: no complaints Cardiac: no complaints Gastrointestinal: no complaints Genitourinary/Sexual: no complaints Ob/Gyn: no complaints Musculoskeletal: no complaints Hematology: no complaints Neurologic/Psych: no complaints   Objective:  Physical Examination:  BP (!) 151/91 (BP Location: Left Arm, Patient Position: Sitting)   Pulse 85   Temp 97.7 F (36.5 C) (Tympanic)   Resp 20   Ht _0  (1.549 m)   Wt 186 lb 9.6 oz (84.6 kg)   BMI 35.26 kg/m     ECOG Performance Status: 0 - Asymptomatic  GENERAL: Patient is a well appearing female in no acute distress HEENT:  Sclera clear. Anicteric NODES:  Negative axillary, supraclavicular, inguinal lymph node survery LUNGS:  Clear to auscultation bilaterally.   HEART:   Regular rate and rhythm.  ABDOMEN:  Soft, nontender.  No hernias, incisions well healed. No masses or ascites EXTREMITIES:  No peripheral edema. Atraumatic. No cyanosis SKIN:  Clear with no obvious rashes or skin changes.  NEURO:  Nonfocal. Well oriented.  Appropriate affect.  Pelvic: exam chaperoned by NP; Vulva: normal appearing w/o masses, tenderness or lesions; vagina: normal vagina, very narrow - used narrowed speculum; adenexa: no masses, surgically absent bilateral but limited exam d/t anatomy and body habitus; Uterus: surgically absent, vaginal cuff well  healed; Cervix: absent; Rectal: confirmatory.   Lab Review Labs on site today: CA125 ordered  Radiologic Imaging: NA    Assessment:  Latoya Lewis is a 53 y.o. 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 Clinically NED.   Hypertension- elevated BP today; asymptomatic  Health maintenance-colonoscopy and mammograms are up-to-date per PCP  Medical co-morbidities complicating care: HTN, obesity and prior abdominal surgery. Positive family history of cancer.  Plan:   Problem List Items Addressed This Visit      Endocrine   Malignant neoplasm of ovary ([place]) - Primary   Relevant Orders   CA 125     CA125 today is pending.   We again discussed the NCCN guidelines for surveillance including annual exams including pelvic and Ca125.    Blood pressure again elevated in clinic today.  Encouraged her to continue home monitoring as she ensures it has been within goal and she is very knowledgeable and compliant with her care.  Suggested return to clinic in 1 year for continued surveillance with CA-125 at that time or sooner if concerning symptoms.  Continue to see Dr. Ginette Pitman for primary care needs and recommended screening studies.  Beckey Rutter, DNP, AGNP-C [place] at [facility] [phone] (work cell) [phone] (office)  I  personally reviewed the patient's history, completed key elements of her exam, and was involved in decision making in conjunction with Ms. Beckey Rutter.   A total of 25 minutes were spent with the patient/family today; >50% was spent in education, counseling and coordination of care for ovarian cancer.   Gillis Ends, MD

## 2019-03-21 ENCOUNTER — Telehealth: Payer: Self-pay | Admitting: Nurse Practitioner

## 2019-03-21 LAB — CA 125: Cancer Antigen (CA) 125: 34.4 U/mL (ref 0.0–38.1)

## 2019-03-21 NOTE — Telephone Encounter (Signed)
Called to discuss results of ca-125. Result from 03/20/2019 was 34.4.  Previously has been in the teens.  She was asymptomatic and had a negative exam on 03/20/2019 and I plan to discuss with her options of repeating tumor marker in 3 or 6 months and returning to clinic sooner if tumor marker continues to rise for reevaluation and repeat pelvic exam sooner than current scheduled follow-up.   No answer-left message for patient to return call.

## 2019-03-22 ENCOUNTER — Other Ambulatory Visit: Payer: Self-pay | Admitting: Nurse Practitioner

## 2019-03-22 DIAGNOSIS — C569 Malignant neoplasm of unspecified ovary: Secondary | ICD-10-CM

## 2019-04-03 NOTE — Telephone Encounter (Signed)
Spoke to patient on 8/7. She is agreeable with plan. Order entered for ca-125 and appointment made for lab only.

## 2019-06-26 ENCOUNTER — Inpatient Hospital Stay: Payer: Managed Care, Other (non HMO) | Attending: Oncology

## 2019-06-26 ENCOUNTER — Other Ambulatory Visit: Payer: Self-pay

## 2019-06-26 DIAGNOSIS — Z8543 Personal history of malignant neoplasm of ovary: Secondary | ICD-10-CM | POA: Insufficient documentation

## 2019-06-26 DIAGNOSIS — C569 Malignant neoplasm of unspecified ovary: Secondary | ICD-10-CM

## 2019-06-27 LAB — CA 125: Cancer Antigen (CA) 125: 33.6 U/mL (ref 0.0–38.1)

## 2019-09-03 ENCOUNTER — Other Ambulatory Visit: Payer: Self-pay | Admitting: Internal Medicine

## 2019-09-03 DIAGNOSIS — Z1231 Encounter for screening mammogram for malignant neoplasm of breast: Secondary | ICD-10-CM

## 2019-10-01 ENCOUNTER — Ambulatory Visit
Admission: RE | Admit: 2019-10-01 | Discharge: 2019-10-01 | Disposition: A | Discharge status: Home / Self Care | Payer: Managed Care, Other (non HMO) | Source: Ambulatory Visit | Attending: Internal Medicine | Admitting: Internal Medicine

## 2019-10-01 DIAGNOSIS — Z1231 Encounter for screening mammogram for malignant neoplasm of breast: Secondary | ICD-10-CM

## 2019-11-27 ENCOUNTER — Encounter: Payer: Self-pay | Admitting: Obstetrics and Gynecology

## 2020-03-18 ENCOUNTER — Inpatient Hospital Stay: Payer: Managed Care, Other (non HMO) | Attending: Obstetrics and Gynecology | Admitting: Obstetrics and Gynecology

## 2020-03-18 ENCOUNTER — Inpatient Hospital Stay: Payer: Managed Care, Other (non HMO)

## 2020-03-18 ENCOUNTER — Other Ambulatory Visit: Payer: Self-pay

## 2020-03-18 ENCOUNTER — Other Ambulatory Visit: Payer: Managed Care, Other (non HMO)

## 2020-03-18 ENCOUNTER — Ambulatory Visit: Payer: Managed Care, Other (non HMO)

## 2020-03-18 VITALS — BP 140/82 | HR 91 | Temp 98.9°F | Resp 16 | Ht 61.0 in | Wt 182.7 lb

## 2020-03-18 DIAGNOSIS — C569 Malignant neoplasm of unspecified ovary: Secondary | ICD-10-CM

## 2020-03-18 DIAGNOSIS — Z8543 Personal history of malignant neoplasm of ovary: Secondary | ICD-10-CM

## 2020-03-18 DIAGNOSIS — Z08 Encounter for follow-up examination after completed treatment for malignant neoplasm: Secondary | ICD-10-CM

## 2020-03-18 DIAGNOSIS — Z90710 Acquired absence of both cervix and uterus: Secondary | ICD-10-CM | POA: Diagnosis not present

## 2020-03-18 DIAGNOSIS — Z6834 Body mass index (BMI) 34.0-34.9, adult: Secondary | ICD-10-CM | POA: Insufficient documentation

## 2020-03-18 DIAGNOSIS — I10 Essential (primary) hypertension: Secondary | ICD-10-CM | POA: Insufficient documentation

## 2020-03-18 DIAGNOSIS — E669 Obesity, unspecified: Secondary | ICD-10-CM | POA: Insufficient documentation

## 2020-03-18 DIAGNOSIS — Z9221 Personal history of antineoplastic chemotherapy: Secondary | ICD-10-CM

## 2020-03-18 NOTE — Progress Notes (Signed)
Pt had a tooth problem and she got an atb and since then she has had some vaginal draining of white/ clear for few days. No other issues

## 2020-03-18 NOTE — Progress Notes (Signed)
Gynecologic Oncology Progress Visit   Chief Concern: continued surveillance for h/o ovarian cancer  Subjective:  Latoya Lewis is a 54 y.o. 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    Last mammogram 10/01/19 was bi-rads category 1: negative  Colonoscopy on 05/07/2018 consistent with diverticulosis.  Recommendation to repeat in 04/2023.  She has a PCP who does all her screening tests.   She continues to feel well and denies specific complaints. She works as a PA at [employer]. CA125 is pending today.     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 CA125=92. She underwent TRS followed by carboplatin and paclitaxel x 6.   05/1994 Second look surgery by Dr. Clarene Essex at [facility] was positive (peritoneum, nodes, LMP histology). She was treated with cisplatin and paclitaxel x 6 more cycles followed by one year of maintenance Hexelan.   She has been NED since.    CA-125   8/4/202  34.4   06/26/2019  33.6.   Genetic testing: Negative based on [facility]: [place] Latoya Lewis  Myrisk testing was negative for BRCA1/2 or other mutations in APC, ATM, BARD1, BMPR1A, BRIP1, CDH1, CDK4, CDKN2A, CHEK2, EPCAM (large rearrangement only), MLH1, MSH2, MSH6, MUTYH, NBN, PALB2, PMS2, PTEN, RAD51C, RAD51D, SMAD4, STK11, TP53. POLE, POLD1, and GREM1 sequencing in select regions also negative.   Problem List: Patient Active Problem List   Diagnosis Date Noted  . Allergy to environmental factors 11/04/2015  . BP (high blood pressure) 11/04/2015  . Malignant neoplasm  of ovary ([place]) 11/04/2015  . Ovarian cancer ([place]) 11/04/2015    Past Medical History: Past Medical History:  Diagnosis Date  . History of colonic polyps    s/p colonoscopy x two  . Hypertension   . Ovarian cancer ([place]) 1995   stage 3    Past Surgical History: Past Surgical History:  Procedure Laterality Date  . ABDOMINAL HYSTERECTOMY  1995   complete Hyst  . exploratoy hysteroscopy     1995    Past Gynecologic History:  Menarche: 14  OB History:  OB History  Gravida Para Term Preterm AB Living  0 0 0 0 0 0  SAB TAB Ectopic Multiple Live Births  0 0 0 0      Family History: Family History  Problem Relation Age of Onset  . Cancer Maternal Grandmother        stomach cancer  . Cancer Paternal Uncle        prostate cancer - 2 uncles > 19 y.o  . Colon cancer Paternal Uncle        wtih mets  . Breast cancer Neg Hx     Social History: Social History   Socioeconomic History  . Marital status: Single    Spouse name: Not on file  . Number of children: Not on file  . Years of education: Not on file  . Highest education level: Not on file  Occupational History  . Not on file  Tobacco Use  . Smoking status: Never Smoker  . Smokeless tobacco: Never Used  Substance and Sexual Activity  . Alcohol use: No    Alcohol/week: 0.0 standard drinks  . Drug use: No  . Sexual activity: Never  Other Topics Concern  . Not on file  Social History Narrative  . Not on file   Social Determinants of Health   Financial Resource Strain:   . Difficulty of Paying Living Expenses:   Food Insecurity:   . Worried About Charity fundraiser in the Last Year:   . Arboriculturist in the Last Year:   Transportation Needs:   . Film/video editor (Medical):   Marland Kitchen Lack of Transportation (Non-Medical):   Physical Activity:   . Days of Exercise per Week:   . Minutes of Exercise per Session:   Stress:   . Feeling of Stress :   Social Connections:   . Frequency of Communication  with Friends and Family:   . Frequency of Social Gatherings with Friends and Family:   . Attends Religious Services:   . Active Member of Clubs or Organizations:   . Attends Archivist Meetings:   Marland Kitchen Marital Status:   Intimate Partner Violence:   . Fear of Current or Ex-Partner:   . Emotionally Abused:   Marland Kitchen Physically Abused:   . Sexually Abused:     Allergies: Allergies  Allergen Reactions  . Pollen Extract Anaphylaxis  . Contrast Media [Iodinated Diagnostic Agents]   . Fish Allergy Other (See Comments)  . Other Other (See Comments)    Foods, citrus fruits, trees    Current Medications: Current Outpatient Medications  Medication Sig Dispense Refill  . Azelastine HCl 0.15 % SOLN INHALE 2 SPRAYS IN EACH NOSTRIL TWICE A DAY A NEEDED  7  . calcium-vitamin D (SM CALCIUM 500/VITAMIN D3) 500-400 MG-UNIT tablet 1 tablet daily.     Marland Kitchen loratadine (CLARITIN) 10 MG tablet Take 10 mg by mouth daily as needed for allergies.    Marland Kitchen losartan-hydrochlorothiazide (HYZAAR) 50-12.5 MG tablet Take 1 tablet by mouth daily.    . montelukast (SINGULAIR) 10 MG tablet Take 10 mg by mouth daily.     . Multiple Vitamin (MULTIVITAMIN) capsule Take 1 capsule by mouth daily.     . Triamcinolone Acetonide (NASACORT AQ NA) Place 1 spray into the nose daily.     No current facility-administered medications for this visit.   Review of Systems General: no complaints  HEENT: no complaints  Lungs: no complaints  Cardiac: no complaints  GI: no complaints  GU: no complaints  Musculoskeletal: no complaints  Extremities: no complaints  Skin: no complaints  Neuro: no complaints  Endocrine: no complaints  Psych: no complaints        Objective:  Physical Examination:  BP 140/82   Pulse 91   Temp 98.9 F (37.2 C) (Oral)   Resp 16   Ht '5\' 1"'  (1.549 m)   Wt 182 lb 11.2 oz (82.9 kg)   BMI 34.52 kg/m     ECOG Performance Status: 0 - Asymptomatic  GENERAL: Patient is a well appearing female in  no acute distress HEENT:  Sclera clear. Anicteric NODES:  Negative axillary, supraclavicular, inguinal lymph node survery LUNGS:  Clear to auscultation bilaterally.   HEART:  Regular rate and rhythm.  ABDOMEN:  Soft, nontender.  Incisions well healed. No masses or ascites EXTREMITIES:  No peripheral edema. Atraumatic. No cyanosis SKIN:  Clear with no obvious rashes or skin changes.  NEURO:  Nonfocal. Well oriented.  Appropriate affect.  Pelvic: exam  chaperoned by CMA; Vulva: erythematous at the introitus o/Lewis normal appearing Lewis/o masses, tenderness or lesions; vagina: normal vagina, very narrow - used narrowed speculum; adenexa: no masses, surgically absent bilateral but limited exam d/t anatomy and body habitus; Uterus: surgically absent, vaginal cuff well healed; Cervix: absent; Rectal: not performed  Lab Review Labs on site today: CA125 ordered  Radiologic Imaging: NA    Assessment:  Latoya Lewis is a 54 y.o. 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 Clinically NED.   Health maintenance-colonoscopy and mammograms are up-to-date   Medical co-morbidities complicating care: HTN, obesity and prior abdominal surgery. Positive family history of cancer.  Plan:   Problem List Items Addressed This Visit      Endocrine   Malignant neoplasm of ovary ([place]) - Primary     CA125 today is pending.   We previously discussed the NCCN guidelines for surveillance including annual exams including pelvic and Ca125.    Suggested return to clinic in 1 year for continued surveillance with CA-125 at that time or sooner if concerning symptoms.  Continue to see Dr. Ginette Pitman for primary care needs and recommended screening studies. Recommended she discuss DEXA scanning with her PCP.   Beckey Rutter, DNP, AGNP-C [place] at [facility] [phone] (clinic)  I personally reviewed the patient's history,  completed key elements of her exam, and was involved in decision making in conjunction with Ms. Beckey Rutter.   Gillis Ends, MD

## 2020-03-19 ENCOUNTER — Telehealth: Payer: Self-pay | Admitting: Nurse Practitioner

## 2020-03-19 LAB — CA 125: Cancer Antigen (CA) 125: 61.6 U/mL — ABNORMAL HIGH (ref 0.0–38.1)

## 2020-03-19 NOTE — Telephone Encounter (Signed)
CA 125 elevated. Discussed with Dr. Theora Gianotti. She recommends repeating in 6 weeks and f/u w/ dr secord to discuss next day. Left message for patient.

## 2020-05-04 ENCOUNTER — Inpatient Hospital Stay: Payer: Managed Care, Other (non HMO) | Attending: Obstetrics and Gynecology

## 2020-05-04 ENCOUNTER — Other Ambulatory Visit: Payer: Self-pay

## 2020-05-04 DIAGNOSIS — I10 Essential (primary) hypertension: Secondary | ICD-10-CM | POA: Insufficient documentation

## 2020-05-04 DIAGNOSIS — Z08 Encounter for follow-up examination after completed treatment for malignant neoplasm: Secondary | ICD-10-CM | POA: Diagnosis present

## 2020-05-04 DIAGNOSIS — Z9221 Personal history of antineoplastic chemotherapy: Secondary | ICD-10-CM | POA: Diagnosis not present

## 2020-05-04 DIAGNOSIS — Z8543 Personal history of malignant neoplasm of ovary: Secondary | ICD-10-CM | POA: Insufficient documentation

## 2020-05-04 DIAGNOSIS — C569 Malignant neoplasm of unspecified ovary: Secondary | ICD-10-CM

## 2020-05-05 LAB — CA 125: Cancer Antigen (CA) 125: 34.1 U/mL (ref 0.0–38.1)

## 2020-05-06 ENCOUNTER — Inpatient Hospital Stay (HOSPITAL_BASED_OUTPATIENT_CLINIC_OR_DEPARTMENT_OTHER): Payer: Managed Care, Other (non HMO) | Admitting: Obstetrics and Gynecology

## 2020-05-06 ENCOUNTER — Other Ambulatory Visit: Payer: Self-pay

## 2020-05-06 ENCOUNTER — Encounter: Payer: Self-pay | Admitting: Obstetrics and Gynecology

## 2020-05-06 VITALS — BP 139/90 | HR 89 | Temp 97.8°F | Resp 20 | Ht 61.0 in | Wt 179.7 lb

## 2020-05-06 DIAGNOSIS — Z8543 Personal history of malignant neoplasm of ovary: Secondary | ICD-10-CM

## 2020-05-06 DIAGNOSIS — Z9221 Personal history of antineoplastic chemotherapy: Secondary | ICD-10-CM | POA: Diagnosis not present

## 2020-05-06 DIAGNOSIS — C569 Malignant neoplasm of unspecified ovary: Secondary | ICD-10-CM

## 2020-05-06 DIAGNOSIS — Z08 Encounter for follow-up examination after completed treatment for malignant neoplasm: Secondary | ICD-10-CM

## 2020-05-06 NOTE — Progress Notes (Signed)
Gynecologic Oncology Progress Visit   Chief Concern: continued surveillance for h/o ovarian cancer  Subjective:  Latoya Lewis is a 54 y.o. 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   She was seen in clinic on 03/18/2020. At that time, CA 125 was elevated at 61.6. Over past year, ca 125 had been more elevated in 30s as compared to teens prior. Patient was having dental procedure at the time but otherwise was asymptomatic. CA125 was redrawn on 05/04/20 and was 34.1. Today, patient is feeling good and has no complaints.    Last mammogram 10/01/19 was bi-rads category 1: negative  Colonoscopy on 05/07/2018 consistent with diverticulosis.  Recommendation to repeat in 04/2023.  She has a PCP who does all her screening tests.    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 CA125=92. She underwent TRS followed by carboplatin and paclitaxel x 6.   05/1994 Second look surgery by Dr. Clarene Essex at [facility] was positive (peritoneum, nodes, LMP histology). She was treated with cisplatin and paclitaxel x 6 more cycles followed by one year of maintenance Hexelan.   She has been NED since.    CA-125   8/4/202  34.4   06/26/2019  33.6. 03/18/2020 61.6 05/04/20 34.1  Genetic testing: Negative based on [facility]  Myrisk testing was negative for BRCA1/2 or other mutations in APC, ATM, BARD1, BMPR1A, BRIP1, CDH1, CDK4, CDKN2A, CHEK2, EPCAM (large rearrangement only), MLH1, MSH2, MSH6, MUTYH, NBN, PALB2, PMS2, PTEN, RAD51C, RAD51D, SMAD4, STK11, TP53.  POLE, POLD1, and GREM1 sequencing in select regions also negative.   Problem List: Patient Active Problem List   Diagnosis Date Noted   Allergy to environmental factors 11/04/2015   BP (high blood pressure) 11/04/2015   Malignant neoplasm of ovary ([place]) 11/04/2015   Ovarian cancer ([place]) 11/04/2015    Past Medical History: Past Medical History:  Diagnosis Date   History of colonic polyps    s/p colonoscopy x two   Hypertension    Ovarian cancer ([place]) 1995   stage 3    Past Surgical History: Past Surgical History:  Procedure Laterality Date   ABDOMINAL HYSTERECTOMY  1995   complete Hyst   exploratoy hysteroscopy     1995    Past Gynecologic History:  Menarche: 14  OB History:  OB History  Gravida Para Term Preterm AB Living  0 0 0 0 0 0  SAB TAB Ectopic Multiple Live Births  0 0 0 0      Family History: Family History  Problem Relation Age of Onset   Cancer Maternal Grandmother        stomach cancer   Cancer Paternal Uncle        prostate cancer - 2 uncles > 17 y.o   Colon cancer Paternal Uncle        wtih mets   Breast cancer Neg Hx     Social History: Social History   Socioeconomic History   Marital status: Single    Spouse name: Not on file   Number  of children: Not on file   Years of education: Not on file   Highest education level: Not on file  Occupational History   Not on file  Tobacco Use   Smoking status: Never Smoker   Smokeless tobacco: Never Used  Vaping Use   Vaping Use: Never used  Substance and Sexual Activity   Alcohol use: No    Alcohol/week: 0.0 standard drinks   Drug use: No   Sexual activity: Never  Other Topics Concern   Not on file  Social History Narrative   Not on file   Social Determinants of Health   Financial Resource Strain:    Difficulty of Paying Living Expenses: Not on file  Food Insecurity:    Worried About Latoya Lewis in the Last Year: Not on file   Ran Out of Food  in the Last Year: Not on file  Transportation Needs:    Lack of Transportation (Medical): Not on file   Lack of Transportation (Non-Medical): Not on file  Physical Activity:    Days of Exercise per Week: Not on file   Minutes of Exercise per Session: Not on file  Stress:    Feeling of Stress : Not on file  Social Connections:    Frequency of Communication with Friends and Family: Not on file   Frequency of Social Gatherings with Friends and Family: Not on file   Attends Religious Services: Not on file   Active Member of Clubs or Organizations: Not on file   Attends Archivist Meetings: Not on file   Marital Status: Not on file  Intimate Partner Violence:    Fear of Current or Ex-Partner: Not on file   Emotionally Abused: Not on file   Physically Abused: Not on file   Sexually Abused: Not on file    Allergies: Allergies  Allergen Reactions   Pollen Extract Anaphylaxis   Contrast Media [Iodinated Diagnostic Agents]    Fish Allergy Other (See Comments)   Other Other (See Comments)    Foods, citrus fruits, trees    Current Medications: Current Outpatient Medications  Medication Sig Dispense Refill   Azelastine HCl 0.15 % SOLN INHALE 2 SPRAYS IN EACH NOSTRIL TWICE A DAY A NEEDED  7   calcium-vitamin D (SM CALCIUM 500/VITAMIN D3) 500-400 MG-UNIT tablet 1 tablet daily.      loratadine (CLARITIN) 10 MG tablet Take 10 mg by mouth daily as needed for allergies.     losartan-hydrochlorothiazide (HYZAAR) 50-12.5 MG tablet Take 1 tablet by mouth daily.     montelukast (SINGULAIR) 10 MG tablet Take 10 mg by mouth daily.      Multiple Vitamin (MULTIVITAMIN) capsule Take 1 capsule by mouth daily.      Triamcinolone Acetonide (NASACORT AQ NA) Place 1 spray into the nose daily.     Calcium Carb-Cholecalciferol (OYSTER SHELL CALCIUM) 500-400 MG-UNIT TABS Take by mouth. (Patient not taking: Reported on 05/06/2020)     No current facility-administered  medications for this visit.   Review of Systems General:  no complaints Skin: no complaints Eyes: no complaints HEENT: no complaints Breasts: no complaints Pulmonary: no complaints Cardiac: no complaints Gastrointestinal: no complaints Genitourinary/Sexual: no complaints Ob/Gyn: no complaints Musculoskeletal: no complaints Hematology: no complaints Neurologic/Psych: no complaints    Objective:  Physical Examination:  BP 139/90    Pulse 89    Temp 97.8 F (36.6 C)    Resp 20    Ht 5' 1" (1.549 m)    Wt  179 lb 11.2 oz (81.5 kg)    SpO2 100%    BMI 33.95 kg/m     ECOG Performance Status: 0 - Asymptomatic  GENERAL: Patient is a well appearing female in no acute distress HEENT:  Sclera clear. Anicteric LUNGS:  Clear to auscultation bilaterally.   HEART:  Regular rate and rhythm.  ABDOMEN:  Soft, nontender.  No hernias, incisions well healed. No masses or ascites EXTREMITIES:  No peripheral edema. Atraumatic. No cyanosis NEURO:  Nonfocal. Well oriented.  Appropriate affect.  Pelvic: deferred from 03/18/2020 exam chaperoned by CMA; Vulva: erythematous at the introitus o/w normal appearing w/o masses, tenderness or lesions; vagina: normal vagina, very narrow - used narrowed speculum; adenexa: no masses, surgically absent bilateral but limited exam d/t anatomy and body habitus; Uterus: surgically absent, vaginal cuff well healed; Cervix: absent; Rectal: not performed   Lab Review  CA125 as noted  Radiologic Imaging: NA    Assessment:  AMARI BURNSWORTH is a 54 y.o. 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 Clinically NED. CA125 improved  Health maintenance-colonoscopy and mammograms are up-to-date   Medical co-morbidities complicating care: HTN, obesity and prior abdominal surgery. Positive family history of cancer.  Plan:   Problem List Items Addressed This Visit      Endocrine   Malignant  neoplasm of ovary ([place]) - Primary     CA125 values reviewed with Ms. Trolinger. The findings are very reassuring and we suspect elevation was due to her dental procedure.   We previously discussed the NCCN guidelines for surveillance including annual exams including pelvic and Ca125.    Suggested return to clinic in 1 year for continued surveillance with CA-125 at that time or sooner if concerning symptoms.  Continue to see Dr. Ginette Pitman for primary care needs and recommended screening studies. Recommended she discuss DEXA scanning with her PCP. She is seeing her PCP in January 2022.   I personally saw the patient, performed her exam, and was involved in decision making. Ms. Beckey Rutter scribed the note.   Gillis Ends, MD

## 2020-09-08 ENCOUNTER — Other Ambulatory Visit: Payer: Self-pay | Admitting: Internal Medicine

## 2020-09-08 DIAGNOSIS — Z1231 Encounter for screening mammogram for malignant neoplasm of breast: Secondary | ICD-10-CM

## 2020-10-06 ENCOUNTER — Ambulatory Visit
Admission: RE | Admit: 2020-10-06 | Discharge: 2020-10-06 | Disposition: A | Discharge status: Home / Self Care | Payer: Managed Care, Other (non HMO) | Source: Ambulatory Visit | Attending: Internal Medicine | Admitting: Internal Medicine

## 2020-10-06 ENCOUNTER — Other Ambulatory Visit: Payer: Self-pay

## 2020-10-06 DIAGNOSIS — Z1231 Encounter for screening mammogram for malignant neoplasm of breast: Secondary | ICD-10-CM | POA: Insufficient documentation

## 2021-03-24 ENCOUNTER — Ambulatory Visit: Payer: Managed Care, Other (non HMO)

## 2021-03-24 ENCOUNTER — Other Ambulatory Visit: Payer: Managed Care, Other (non HMO)

## 2021-05-05 ENCOUNTER — Inpatient Hospital Stay: Payer: Managed Care, Other (non HMO) | Attending: Obstetrics and Gynecology

## 2021-05-05 DIAGNOSIS — C569 Malignant neoplasm of unspecified ovary: Secondary | ICD-10-CM

## 2021-05-05 DIAGNOSIS — Z8543 Personal history of malignant neoplasm of ovary: Secondary | ICD-10-CM | POA: Diagnosis not present

## 2021-05-05 DIAGNOSIS — Z9221 Personal history of antineoplastic chemotherapy: Secondary | ICD-10-CM | POA: Insufficient documentation

## 2021-05-05 DIAGNOSIS — R971 Elevated cancer antigen 125 [CA 125]: Secondary | ICD-10-CM | POA: Insufficient documentation

## 2021-05-06 LAB — CA 125: Cancer Antigen (CA) 125: 132 U/mL — ABNORMAL HIGH (ref 0.0–38.1)

## 2021-05-12 ENCOUNTER — Inpatient Hospital Stay (HOSPITAL_BASED_OUTPATIENT_CLINIC_OR_DEPARTMENT_OTHER): Payer: Managed Care, Other (non HMO) | Admitting: Obstetrics and Gynecology

## 2021-05-12 VITALS — BP 137/92 | HR 83 | Temp 97.8°F | Resp 20 | Wt 178.3 lb

## 2021-05-12 DIAGNOSIS — Z8543 Personal history of malignant neoplasm of ovary: Secondary | ICD-10-CM | POA: Diagnosis not present

## 2021-05-12 DIAGNOSIS — C569 Malignant neoplasm of unspecified ovary: Secondary | ICD-10-CM

## 2021-05-12 MED ORDER — PREDNISONE 50 MG PO TABS: ORAL_TABLET | ORAL | 0 refills | Status: AC

## 2021-05-12 MED ORDER — DIPHENHYDRAMINE HCL 50 MG PO CAPS: ORAL_CAPSULE | ORAL | 0 refills | Status: AC

## 2021-05-12 NOTE — Progress Notes (Signed)
Gynecologic Oncology Progress Visit   Chief Concern: continued surveillance for h/o ovarian cancer  Subjective:  Latoya Lewis is a 55 y.o. 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   A year ago she had elevated CA125 to 61 and repeat was down to 34. Now CA125 132.  Only symptom is some abdominal cramping when eats lots of fruits and vegetables.  Today, patient is feeling good and has no complaints.   Colonoscopy on 05/07/2018 consistent with diverticulosis.  Recommendation to repeat in 04/2023.  She has a PCP who does all her screening tests.   History of normal mammograms  Gynecologic Oncology History Latoya Lewis is a pleasant G0P0 female with h/o Stage III epithelial ovarian cancer - low malignant potential/borderline vs invasive ovarian cancer. 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 CA125=92. She underwent TRS followed by carboplatin and paclitaxel x 6.   05/1994 Second look surgery by Dr. Clarene Essex at [facility] was positive (peritoneum, nodes, LMP histology). She was treated with cisplatin and paclitaxel x 6 more cycles followed by one year of maintenance Hexelan.   She has been NED since.    CA-125   8/4/202  34.4   06/26/2019  33.6. 03/18/2020 61.6 05/04/20 34.1 921/22  132  Genetic testing: Negative based on [facility]  Myrisk testing was negative for BRCA1/2 or other mutations in APC, ATM, BARD1, BMPR1A, BRIP1, CDH1, CDK4, CDKN2A, CHEK2, EPCAM (large rearrangement only), MLH1, MSH2, MSH6, MUTYH, NBN, PALB2, PMS2, PTEN, RAD51C, RAD51D, SMAD4, STK11, TP53. POLE, POLD1, and GREM1 sequencing in select regions also negative.   Problem List: Patient Active Problem List   Diagnosis Date Noted    Allergy to environmental factors 11/04/2015   BP (high blood pressure) 11/04/2015   Malignant neoplasm of ovary ([place]) 11/04/2015   Ovarian cancer ([place]) 11/04/2015    Past Medical History: Past Medical History:  Diagnosis Date   History of colonic polyps    s/p colonoscopy x two   Hypertension    Ovarian cancer ([place]) 1995   stage 3    Past Surgical History: Past Surgical History:  Procedure Laterality Date   ABDOMINAL HYSTERECTOMY  1995   complete Hyst   exploratoy hysteroscopy     1995    Past Gynecologic History:  Menarche: 14  OB History:  OB History  Gravida Para Term Preterm AB Living  0 0 0 0 0 0  SAB IAB Ectopic Multiple Live Births  0 0 0 0      Family History: Family History  Problem Relation Age of Onset   Cancer Maternal Grandmother        stomach cancer   Cancer Paternal Uncle        prostate cancer - 2 uncles > 57 y.o   Colon cancer Paternal Uncle        wtih mets   Breast cancer Neg Hx     Social History: Social History   Socioeconomic History   Marital status: Single    Spouse name: Not on file   Number of children: Not on file   Years of education: Not on file   Highest education level: Not on file  Occupational History   Not on file  Tobacco Use   Smoking status: Never   Smokeless tobacco: Never  Vaping Use   Vaping Use: Never used  Substance and Sexual Activity   Alcohol use: No    Alcohol/week: 0.0 standard drinks   Drug use: No   Sexual activity: Never  Other Topics Concern   Not on file  Social History Narrative   Not on file   Social Determinants of Health   Financial Resource Strain: Not on file  Food Insecurity: Not on file  Transportation Needs: Not on file  Physical Activity: Not on file  Stress: Not on file  Social Connections: Not on file  Intimate Partner Violence: Not on file    Allergies: Allergies  Allergen Reactions   Pollen Extract Anaphylaxis   Contrast Media [Iodinated Diagnostic Agents]     Fish Allergy Other (See Comments)   Other Other (See Comments)    Foods, citrus fruits, trees    Current Medications: Current Outpatient Medications  Medication Sig Dispense Refill   Azelastine HCl 0.15 % SOLN INHALE 2 SPRAYS IN EACH NOSTRIL TWICE A DAY A NEEDED  7   calcium-vitamin D (OSCAL-500) 500-400 MG-UNIT tablet 1 tablet daily.      loratadine (CLARITIN) 10 MG tablet Take 10 mg by mouth daily as needed for allergies.     losartan-hydrochlorothiazide (HYZAAR) 50-12.5 MG tablet Take 1 tablet by mouth daily.     montelukast (SINGULAIR) 10 MG tablet Take 10 mg by mouth daily.      Multiple Vitamin (MULTIVITAMIN) capsule Take 1 capsule by mouth daily.      Triamcinolone Acetonide (NASACORT AQ NA) Place 1 spray into the nose daily.     Wheat Dextrin (BENEFIBER) POWD Take by mouth.     Calcium Carb-Cholecalciferol (OYSTER SHELL CALCIUM) 500-400 MG-UNIT TABS Take by mouth. (Patient not taking: Reported on 05/12/2021)     No current facility-administered medications for this visit.   Review of Systems General:  no complaints Skin: no complaints Eyes: no complaints HEENT: no complaints Breasts: no complaints Pulmonary: no complaints Cardiac: no complaints Gastrointestinal: no complaints Genitourinary/Sexual: no complaints Ob/Gyn: no complaints Musculoskeletal: no complaints Hematology: no complaints Neurologic/Psych: no complaints    Objective:  Physical Examination:  BP (!) 137/92   Pulse 83   Temp 97.8 F (36.6 C)   Resp 20   Wt 178 lb 4.8 oz (80.9 kg)   SpO2 100%   BMI 33.69 kg/m     ECOG Performance Status: 0 - Asymptomatic  GENERAL: Patient is a well appearing female in no acute distress HEENT:  Sclera clear. Anicteric LUNGS:  Clear to auscultation bilaterally.   HEART:  Regular rate and rhythm.  ABDOMEN:  Soft, nontender.  No hernias, incisions well healed. No masses or ascites EXTREMITIES:  No peripheral edema. Atraumatic. No cyanosis NEURO:  Nonfocal. Well  oriented.  Appropriate affect.  Pelvic:  Exam chaperoned by CMA; Vulva: erythematous at the introitus o/w normal appearing w/o masses, tenderness or lesions; vagina: normal vagina, very narrow - used narrowed speculum; adenexa: no masses, Rectal: confirms    Lab Review  CA125 as noted  Radiologic Imaging: NA    Assessment:  PRESSLEY BARSKY is a 55 y.o. 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 Clinically NED. CA125 elevated again this year to 132.  Negative germline genetic testing.  Health maintenance-colonoscopy and mammograms are up-to-date   Medical co-morbidities complicating care: HTN,  obesity and prior abdominal surgery. Positive family history of cancer.  Plan:   Problem List Items Addressed This Visit       Endocrine   Ovarian cancer ([place]) - Primary    Will order CT scan C/A/P in view of higher elevation of CA125 presently.  If CT normal will repeat CA125 in 3 months to assess trend.  If CT concerning will likely have IR do biopsy of any abnormal lesions.  We previously discussed the NCCN guidelines for surveillance including annual exams including pelvic and Ca125.    Suggested return to clinic in 1 year for continued surveillance with CA-125 at that time or sooner if concerning symptoms.  Continue to see Dr. Ginette Pitman for primary care needs and recommended screening studies. Recommended she discuss DEXA scanning with her PCP. She is seeing her PCP in January 2022.    Mellody Drown, MD

## 2021-05-27 ENCOUNTER — Ambulatory Visit
Admission: RE | Admit: 2021-05-27 | Discharge: 2021-05-27 | Disposition: A | Discharge status: Home / Self Care | Payer: Managed Care, Other (non HMO) | Source: Ambulatory Visit | Attending: Nurse Practitioner | Admitting: Nurse Practitioner

## 2021-05-27 ENCOUNTER — Other Ambulatory Visit: Payer: Self-pay

## 2021-05-27 DIAGNOSIS — C569 Malignant neoplasm of unspecified ovary: Secondary | ICD-10-CM | POA: Insufficient documentation

## 2021-05-27 LAB — POCT I-STAT CREATININE: Creatinine, Ser: 0.5 mg/dL (ref 0.44–1.00)

## 2021-05-27 MED ORDER — IOHEXOL 350 MG/ML SOLN
80.0000 mL | Freq: Once | INTRAVENOUS | Status: AC | PRN
  Administered 2021-05-27: 80 mL via INTRAVENOUS

## 2021-05-31 ENCOUNTER — Telehealth: Payer: Self-pay | Admitting: *Deleted

## 2021-05-31 ENCOUNTER — Telehealth: Payer: Self-pay | Admitting: Nurse Practitioner

## 2021-05-31 ENCOUNTER — Telehealth: Payer: Self-pay

## 2021-05-31 NOTE — Telephone Encounter (Signed)
No answer. Left vm to return call to review CT results. Recommend EUS and tumor markers. Mariea Clonts reviewing with gyn onc as well.

## 2021-05-31 NOTE — Telephone Encounter (Signed)
Results of CT sent to Dr's Secord/Berchuck for review.

## 2021-05-31 NOTE — Telephone Encounter (Signed)
Called report IMPRESSION: 1. Large mass in the left upper quadrant of the abdomen which appears to be centered either in the region of the splenic hilum, or in the tail of the pancreas, occupying the space between both organs, exerting considerable mass effect upon adjacent structures, and associated with upper abdominal lymphadenopathy, as detailed above. It is uncertain whether or not this represents a metastatic lesion from the patient's ovarian cancer, or a primary lesion of the pancreatic tail with extensive locoregional invasion and regional lymphadenopathy. Further evaluation with endoscopic ultrasound and ultrasound-guided biopsy is recommended to establish a tissue diagnosis. 2. Multiple tiny 2-3 mm pulmonary nodules scattered throughout the lungs bilaterally, highly nonspecific and statistically likely benign. Attention on follow-up studies is recommended to ensure stability, as metastatic disease (while not favored) is not excluded. 3. Mildly enlarged left axillary lymph node. Correlation with nonemergent mammography is recommended in the near future. 4. Aortic atherosclerosis, in addition to left anterior descending coronary artery disease. Please note that although the presence of coronary artery calcium documents the presence of coronary artery disease, the severity of this disease and any potential stenosis cannot be assessed on this non-gated CT examination. Assessment for potential risk factor modification, dietary therapy or pharmacologic therapy may be warranted, if clinically indicated. 5. Additional incidental findings, as above.   These results will be called to the ordering clinician or representative by the Radiologist Assistant, and communication documented in the PACS or [employer].     Electronically Signed   By: Vinnie Langton M.D.   On: 05/29/2021 07:54

## 2021-06-01 ENCOUNTER — Other Ambulatory Visit: Payer: Self-pay

## 2021-06-01 ENCOUNTER — Telehealth: Payer: Self-pay

## 2021-06-01 NOTE — Telephone Encounter (Signed)
Ms. Aziz returned call. CT scan reviewed. Recommendation for EUS biopsy explained and scheduled for 06/10/21 with Dr. Lynden Ang at [facility]. EUS explained and instructions for EUS sent through [facility]. Encouraged to call with any questions. No blood thinners noted.

## 2021-06-01 NOTE — Telephone Encounter (Signed)
Voicemail left on home and mobile number to return call to discuss CT scan.

## 2021-06-10 ENCOUNTER — Ambulatory Visit
Admission: RE | Admit: 2021-06-10 | Discharge: 2021-06-10 | Disposition: A | Discharge status: Home / Self Care | Payer: Managed Care, Other (non HMO) | Attending: Gastroenterology | Admitting: Gastroenterology

## 2021-06-10 ENCOUNTER — Ambulatory Visit: Payer: Managed Care, Other (non HMO) | Admitting: Anesthesiology

## 2021-06-10 ENCOUNTER — Other Ambulatory Visit: Payer: Managed Care, Other (non HMO)

## 2021-06-10 ENCOUNTER — Encounter: Payer: Self-pay | Admitting: Anesthesiology

## 2021-06-10 ENCOUNTER — Encounter
Admission: RE | Disposition: A | Discharge status: Home / Self Care | Payer: Self-pay | Source: Home / Self Care | Attending: Gastroenterology

## 2021-06-10 DIAGNOSIS — Z86010 Personal history of colonic polyps: Secondary | ICD-10-CM | POA: Diagnosis not present

## 2021-06-10 DIAGNOSIS — Z8543 Personal history of malignant neoplasm of ovary: Secondary | ICD-10-CM | POA: Diagnosis not present

## 2021-06-10 DIAGNOSIS — Z79899 Other long term (current) drug therapy: Secondary | ICD-10-CM | POA: Insufficient documentation

## 2021-06-10 DIAGNOSIS — I10 Essential (primary) hypertension: Secondary | ICD-10-CM | POA: Insufficient documentation

## 2021-06-10 DIAGNOSIS — R1902 Left upper quadrant abdominal swelling, mass and lump: Secondary | ICD-10-CM | POA: Insufficient documentation

## 2021-06-10 DIAGNOSIS — Z91018 Allergy to other foods: Secondary | ICD-10-CM | POA: Diagnosis not present

## 2021-06-10 DIAGNOSIS — Z7952 Long term (current) use of systemic steroids: Secondary | ICD-10-CM | POA: Insufficient documentation

## 2021-06-10 DIAGNOSIS — Z91041 Radiographic dye allergy status: Secondary | ICD-10-CM | POA: Diagnosis not present

## 2021-06-10 DIAGNOSIS — R935 Abnormal findings on diagnostic imaging of other abdominal regions, including retroperitoneum: Secondary | ICD-10-CM | POA: Insufficient documentation

## 2021-06-10 HISTORY — PX: EUS: SHX5427

## 2021-06-10 SURGERY — UPPER ENDOSCOPIC ULTRASOUND (EUS) LINEAR
Anesthesia: General

## 2021-06-10 MED ORDER — PROPOFOL 500 MG/50ML IV EMUL
INTRAVENOUS | Status: DC | PRN
  Administered 2021-06-10: 150 ug/kg/min via INTRAVENOUS

## 2021-06-10 MED ORDER — LIDOCAINE HCL (CARDIAC) PF 100 MG/5ML IV SOSY
PREFILLED_SYRINGE | INTRAVENOUS | Status: DC | PRN
  Administered 2021-06-10: 50 mg via INTRAVENOUS

## 2021-06-10 MED ORDER — PROPOFOL 500 MG/50ML IV EMUL
INTRAVENOUS | Status: AC
  Filled 2021-06-10: qty 50

## 2021-06-10 MED ORDER — SODIUM CHLORIDE 0.9 % IV SOLN: INTRAVENOUS | Status: DC

## 2021-06-10 NOTE — Progress Notes (Signed)
Tumor Board Documentation  Latoya Lewis was presented by Verlon Au, RN, OCN at our Tumor Board on 06/10/2021, which included representatives from medical oncology, internal medicine, navigation, pathology, radiology, genetics, research, palliative care, pulmonology, surgical, pharmacy, nutrition, radiation oncology.  Latoya Lewis currently presents as a current patient, for discussion with history of the following treatments: active survellience, surgical intervention(s).  Additionally, we reviewed previous medical and familial history, history of present illness, and recent lab results along with all available histopathologic and imaging studies. The tumor board considered available treatment options and made the following recommendations: Biopsy (EUS)    The following procedures/referrals were also placed: No orders of the defined types were placed in this encounter.   Clinical Trial Status: not discussed   Staging used: To be determined  National site-specific guidelines   were discussed with respect to the case.  Tumor board is a meeting of clinicians from various specialty areas who evaluate and discuss patients for whom a multidisciplinary approach is being considered. Final determinations in the plan of care are those of the provider(s). The responsibility for follow up of recommendations given during tumor board is that of the provider.   Today's extended care, comprehensive team conference, Latoya Lewis was not present for the discussion and was not examined.   Multidisciplinary Tumor Board is a multidisciplinary case peer review process.  Decisions discussed in the Multidisciplinary Tumor Board reflect the opinions of the specialists present at the conference without having examined the patient.  Ultimately, treatment and diagnostic decisions rest with the primary provider(s) and the patient.

## 2021-06-10 NOTE — Anesthesia Preprocedure Evaluation (Signed)
Anesthesia Evaluation  Patient identified by MRN, date of birth, ID band Patient awake    Airway Mallampati: II       Dental  (+) Teeth Intact   Pulmonary neg pulmonary ROS,    breath sounds clear to auscultation       Cardiovascular Exercise Tolerance: Good hypertension, Pt. on medications  Rhythm:Regular Rate:Normal     Neuro/Psych negative neurological ROS  negative psych ROS   GI/Hepatic negative GI ROS, Neg liver ROS,   Endo/Other  negative endocrine ROS  Renal/GU negative Renal ROS     Musculoskeletal negative musculoskeletal ROS (+)   Abdominal Normal abdominal exam  (+)   Peds negative pediatric ROS (+)  Hematology negative hematology ROS (+)   Anesthesia Other Findings   Reproductive/Obstetrics                             Anesthesia Physical Anesthesia Plan  ASA: 2  Anesthesia Plan: General   Post-op Pain Management:    Induction: Intravenous  PONV Risk Score and Plan:   Airway Management Planned: Nasal Cannula  Additional Equipment:   Intra-op Plan:   Post-operative Plan:   Informed Consent: I have reviewed the patients History and Physical, chart, labs and discussed the procedure including the risks, benefits and alternatives for the proposed anesthesia with the patient or authorized representative who has indicated his/her understanding and acceptance.     Dental Advisory Given  Plan Discussed with: Anesthesiologist, CRNA and Surgeon  Anesthesia Plan Comments: (Patient consented for risks of anesthesia including but not limited to:  - adverse reactions to medications - risk of airway placement if required - damage to eyes, teeth, lips or other oral mucosa - nerve damage due to positioning  - sore throat or hoarseness - Damage to heart, brain, nerves, lungs, other parts of body or loss of life  Patient voiced understanding.)        Anesthesia  Quick Evaluation

## 2021-06-10 NOTE — Op Note (Signed)
[facility] Gastroenterology Patient Name: Latoya Lewis Procedure Date: 06/10/2021 12:05 PM MRN: [id number] Account #: [id number] Date of Birth: [birth year] Admit Type: Outpatient Age: 55 Room: [facility] ENDO ROOM 3 Gender: Female Note Status: Finalized Instrument Name: Linear EUS Scope [id number] Procedure:             Upper EUS Indications:           Suspected mass on abdominal/pelvic CT scan Patient Profile:       Refer to note in patient chart for documentation of                         history and physical. Providers:             Lenetta Quaker. Cephas Darby, MD Referring MD:          Tracie Harrier, MD (Referring MD), Mellody Drown                         (Referring MD) Medicines:             Monitored Anesthesia Care Complications:         No immediate complications. Procedure:             Pre-Anesthesia Assessment:                        - Prior to the procedure, a History and Physical was                         performed, and patient medications and allergies were                         reviewed. The patient is competent. The risks and                         benefits of the procedure and the sedation options and                         risks were discussed with the patient. All questions                         were answered and informed consent was obtained.                         Patient identification and proposed procedure were                         verified by the physician in the pre-procedure area.                         Mental Status Examination: alert and oriented. Airway                         Examination: normal oropharyngeal airway and neck                         mobility. Respiratory Examination: clear to                         auscultation. CV Examination: normal. ASA Grade  Assessment: II - A patient with mild systemic disease.                         After reviewing the risks and benefits, the patient                          was deemed in satisfactory condition to undergo the                         procedure. The anesthesia plan was to use monitored                         anesthesia care (MAC). Immediately prior to                         administration of medications, the patient was                         re-assessed for adequacy to receive sedatives. The                         heart rate, respiratory rate, oxygen saturations,                         blood pressure, adequacy of pulmonary ventilation, and                         response to care were monitored throughout the                         procedure. The physical status of the patient was                         re-assessed after the procedure.                        After obtaining informed consent, the endoscope was                         passed under direct vision. Throughout the procedure,                         the patient's blood pressure, pulse, and oxygen                         saturations were monitored continuously. The Endoscope                         was introduced through the mouth, and advanced to the                         duodenum for ultrasound examination from the                         esophagus, stomach and duodenum. Findings:      ENDOSCOPIC FINDING: :      The examined esophagus was endoscopically normal.      The entire examined stomach was endoscopically normal.      The  examined duodenum was endoscopically normal.      ENDOSONOGRAPHIC FINDING: :      A round mass was identified in the peripancreatic region. The mass was       mixed solid and cystic. The mass measured 63 mm by 53 mm at least in       maximal cross-sectional diameter. The endosonographic borders were       well-defined. Fine needle biopsy was performed. Color Doppler imaging       was utilized prior to needle puncture to confirm a lack of significant       vascular structures within the needle path. Care was also taken to avoid       the cystic  component of the mass. Four passes were made with the 22       gauge SharkCore biopsy needle using a transgastric approach. A visible       core of tissue was obtained. Preliminary cytologic examination and touch       preps were performed. The cellularity of the specimen was adequate.       Final cytology results are pending.      There was no sign of significant endosonographic abnormality in the       pancreatic body and pancreatic neck. The pancreatic duct measured up to       1.4 mm in diameter in the neck and 0.5 mm in the body. It was not well       visualized within the tail. The pancreatic duct was regular in contour.      There was no sign of significant endosonographic abnormality in the left       lobe of the liver. No masses were identified. Impression:            EGD Impression:                        - Normal esophagus.                        - Normal stomach.                        - Normal examined duodenum.                        EUS Impression:                        - A mass was identified in the peripancreatic region.                         Fine needle biopsy performed.                        - There was no sign of significant pathology in the                         pancreatic body and pancreatic neck.                        - There was no evidence of significant pathology in                         the left lobe of the liver.                        -  Significant distortion by the mass percluded clear                         visualization of the pancreas tail and celiac axis. Recommendation:        - Discharge patient to home.                        - Await cytology results.                        - Return to referring physician as previously                         scheduled.                        - The findings and recommendations were discussed with                         the patient and their family. Procedure Code(s):     --- Professional ---                         [phone], Esophagogastroduodenoscopy, flexible,                         transoral; with transendoscopic ultrasound-guided                         intramural or transmural fine needle                         aspiration/biopsy(s), (includes endoscopic ultrasound                         examination limited to the esophagus, stomach or                         duodenum, and adjacent structures) Diagnosis Code(s):     --- Professional ---                        K86.89, Other specified diseases of pancreas                        R93.5, Abnormal findings on diagnostic imaging of                         other abdominal regions, including retroperitoneum CPT copyright 2019 American Medical Association. All rights reserved. The codes documented in this report are preliminary and upon coder review may  be revised to meet current compliance requirements. Dr. Lenetta Quaker. Cephas Darby, MD Lenetta Quaker. Josalin Carneiro, MD 06/10/2021 2:15:22 PM This report has been signed electronically. Number of Addenda: 0 Note Initiated On: 06/10/2021 12:05 PM Estimated Blood Loss:  Estimated blood loss was minimal.      [facility]

## 2021-06-10 NOTE — Anesthesia Procedure Notes (Signed)
Date/Time: 06/10/2021 1:25 PM Performed by: Vaughan Sine Pre-anesthesia Checklist: Patient identified, Emergency Drugs available, Suction available, Patient being monitored and Timeout performed Patient Re-evaluated:Patient Re-evaluated prior to induction Oxygen Delivery Method: Simple face mask Preoxygenation: Pre-oxygenation with 100% oxygen Induction Type: IV induction Airway Equipment and Method: Bite block Placement Confirmation: positive ETCO2 and CO2 detector

## 2021-06-10 NOTE — Transfer of Care (Signed)
Immediate Anesthesia Transfer of Care Note  Patient: Latoya Lewis  Procedure(s) Performed: UPPER ENDOSCOPIC ULTRASOUND (EUS) LINEAR  Patient Location: PACU  Anesthesia Type:General  Level of Consciousness: awake and sedated  Airway & Oxygen Therapy: Patient Spontanous Breathing and Patient connected to nasal cannula oxygen  Post-op Assessment: Report given to RN and Post -op Vital signs reviewed and stable  Post vital signs: Reviewed and stable  Last Vitals:  Vitals Value Taken Time  BP 117/61 06/10/21 1355  Temp    Pulse 79 06/10/21 1357  Resp 19 06/10/21 1357  SpO2 98 % 06/10/21 1357  Vitals shown include unvalidated device data.  Last Pain:  Vitals:   06/10/21 1224  TempSrc: Temporal         Complications: No notable events documented.

## 2021-06-10 NOTE — H&P (Signed)
PRE-PROCEDURE HISTORY AND PHYSICAL   Latoya Lewis presents for her scheduled Procedure(s): UPPER ENDOSCOPIC ULTRASOUND (EUS) LINEAR.  The indication for the procedure(s) is Large mass in the left upper quadrant of the abdomen.  There have been no significant recent changes in the patient's medical status.  Past Medical History:  Diagnosis Date   History of colonic polyps    s/p colonoscopy x two   Hypertension    Ovarian cancer ([place]) 1995   stage 3    Past Surgical History:  Procedure Laterality Date   ABDOMINAL HYSTERECTOMY  1995   complete Hyst   exploratoy hysteroscopy     1995    Allergies Allergies  Allergen Reactions   Pollen Extract Anaphylaxis   Contrast Media [Iodinated Diagnostic Agents]    Fish Allergy Other (See Comments)   Other Other (See Comments)    Foods, citrus fruits, trees    Medications Azelastine HCl, Benefiber, Oyster Shell Calcium, Triamcinolone Acetonide, calcium-vitamin D, diphenhydrAMINE, loratadine, losartan-hydrochlorothiazide, montelukast, multivitamin, and predniSONE  Physical Examination  Body mass index is 33.07 kg/m. BP (!) 130/92   Pulse 91   Temp 98.2 F (36.8 C) (Temporal)   Resp 18   Ht 5\' 1"  (1.549 m)   Wt 79.4 kg   SpO2 100%   BMI 33.07 kg/m  General:   Alert,  pleasant and cooperative in NAD Head:  Normocephalic and atraumatic. Neck:  Supple; no masses or thyromegaly. Lungs:  No audible wheezes    Heart:  Regular rate and rhythm. Abdomen:  Soft, nontender and nondistended. Normal bowel sounds, without guarding, and without rebound.   Neurologic:  Alert and  oriented x4;  grossly normal neurologically.  ASSESSMENT AND PLAN  Latoya Lewis has been evaluated and deemed appropriate to undergo the planned Procedure(s): UPPER ENDOSCOPIC ULTRASOUND (EUS) LINEAR.

## 2021-06-15 LAB — CYTOLOGY - NON PAP

## 2021-06-16 ENCOUNTER — Encounter: Payer: Self-pay | Admitting: Gastroenterology

## 2021-06-17 ENCOUNTER — Telehealth: Payer: Self-pay

## 2021-06-17 NOTE — Telephone Encounter (Signed)
Pathology from EUS biopsy noted and sent to gyn oncology team for review.

## 2021-06-18 ENCOUNTER — Other Ambulatory Visit: Payer: Self-pay

## 2021-06-18 ENCOUNTER — Telehealth: Payer: Self-pay

## 2021-06-18 DIAGNOSIS — C801 Malignant (primary) neoplasm, unspecified: Secondary | ICD-10-CM

## 2021-06-18 NOTE — Telephone Encounter (Signed)
Latoya Lewis returned call. Pathology reviewed. Went over that Dr. Fransisca Connors would like for her to receive 3 cycles of carbo/taxol followed by repeat imaging prior to surgical resection. All questions answered. Appointment offered for Tuesday, 11/8.With her schedule, she preferred Thursday afteroon or any time on Friday. Appointment arranged with Dr. Janese Banks for 11/11 at 1045. Added to gyn tumor board for 11/9.

## 2021-06-18 NOTE — Telephone Encounter (Signed)
Dr. Fransisca Connors has reviewed pathology report and reviewed recent CT. Call placed to Ms. Knobloch to review pathology. Left voicemail on mobile and home phone.

## 2021-06-23 NOTE — Progress Notes (Unsigned)
Tumor Board Documentation  Latoya Lewis was presented by *** at our Tumor Board on 06/23/2021, which included representatives from  .  Latoya Lewis currently presents   with history of the following treatments:  .  Additionally, we reviewed previous medical and familial history, history of present illness, and recent lab results along with all available histopathologic and imaging studies. The tumor board considered available treatment options and made the following recommendations:      The following procedures/referrals were also placed: No orders of the defined types were placed in this encounter.   Clinical Trial Status:     Staging used:    National site-specific guidelines   were discussed with respect to the case.  Tumor board is a meeting of clinicians from various specialty areas who evaluate and discuss patients for whom a multidisciplinary approach is being considered. Final determinations in the plan of care are those of the provider(s). The responsibility for follow up of recommendations given during tumor board is that of the provider.   Today's extended care, comprehensive team conference, Latoya Lewis was not present for the discussion and was not examined.    CA 125 has been rising. CT imaging shows large luq abdominal mass. Upper abdominal lymphadenopathy. Underwent EUS. Pathology showed: positive for pax 8 and wt1. Positive for adenocarcinoma compatible with serous carcinoma. Patient was previously managed by Dr. Clarene Essex. Possibly some history of low grade. 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 Case was discussed with Dr. Mariah Milling at [facility]. Plan was for 3 cycles of chemotherapy then re-image. If low grade chances are there won't be huge response and ultimately surgical resection would be preferred. Also send foundation testing.  She will see Dr. Janese Banks this Friday to discuss chemotherapy with carbo-taxol. If she does  surgery at [facility], Dr Theora Gianotti recommends getting tissue for pdl testing.

## 2021-06-25 ENCOUNTER — Other Ambulatory Visit: Payer: Self-pay

## 2021-06-25 ENCOUNTER — Inpatient Hospital Stay: Payer: Managed Care, Other (non HMO)

## 2021-06-25 ENCOUNTER — Telehealth: Payer: Self-pay

## 2021-06-25 ENCOUNTER — Inpatient Hospital Stay: Payer: Managed Care, Other (non HMO) | Attending: Obstetrics and Gynecology | Admitting: Oncology

## 2021-06-25 ENCOUNTER — Encounter: Payer: Self-pay | Admitting: Oncology

## 2021-06-25 VITALS — BP 137/98 | HR 109 | Temp 98.5°F | Resp 18 | Ht 62.0 in | Wt 176.4 lb

## 2021-06-25 DIAGNOSIS — C561 Malignant neoplasm of right ovary: Secondary | ICD-10-CM

## 2021-06-25 DIAGNOSIS — K869 Disease of pancreas, unspecified: Secondary | ICD-10-CM | POA: Diagnosis not present

## 2021-06-25 DIAGNOSIS — Z90710 Acquired absence of both cervix and uterus: Secondary | ICD-10-CM

## 2021-06-25 DIAGNOSIS — C569 Malignant neoplasm of unspecified ovary: Secondary | ICD-10-CM

## 2021-06-25 LAB — CBC WITH DIFFERENTIAL/PLATELET
Abs Immature Granulocytes: 0.02 10*3/uL (ref 0.00–0.07)
Basophils Absolute: 0 10*3/uL (ref 0.0–0.1)
Basophils Relative: 0 %
Eosinophils Absolute: 0.1 10*3/uL (ref 0.0–0.5)
Eosinophils Relative: 1 %
HCT: 43.1 % (ref 36.0–46.0)
Hemoglobin: 14.1 g/dL (ref 12.0–15.0)
Immature Granulocytes: 0 %
Lymphocytes Relative: 14 %
Lymphs Abs: 1.4 10*3/uL (ref 0.7–4.0)
MCH: 29.8 pg (ref 26.0–34.0)
MCHC: 32.7 g/dL (ref 30.0–36.0)
MCV: 91.1 fL (ref 80.0–100.0)
Monocytes Absolute: 0.9 10*3/uL (ref 0.1–1.0)
Monocytes Relative: 9 %
Neutro Abs: 7.7 10*3/uL (ref 1.7–7.7)
Neutrophils Relative %: 76 %
Platelets: 232 10*3/uL (ref 150–400)
RBC: 4.73 MIL/uL (ref 3.87–5.11)
RDW: 12.4 % (ref 11.5–15.5)
WBC: 10.1 10*3/uL (ref 4.0–10.5)
nRBC: 0 % (ref 0.0–0.2)

## 2021-06-25 LAB — COMPREHENSIVE METABOLIC PANEL
ALT: 24 U/L (ref 0–44)
AST: 27 U/L (ref 15–41)
Albumin: 4.5 g/dL (ref 3.5–5.0)
Alkaline Phosphatase: 78 U/L (ref 38–126)
Anion gap: 9 (ref 5–15)
BUN: 11 mg/dL (ref 6–20)
CO2: 31 mmol/L (ref 22–32)
Calcium: 9.6 mg/dL (ref 8.9–10.3)
Chloride: 98 mmol/L (ref 98–111)
Creatinine, Ser: 0.55 mg/dL (ref 0.44–1.00)
GFR, Estimated: >60 mL/min (ref >60)
Glucose, Bld: 110 mg/dL — ABNORMAL HIGH (ref 70–99)
Potassium: 3.6 mmol/L (ref 3.5–5.1)
Sodium: 138 mmol/L (ref 135–145)
Total Bilirubin: 0.6 mg/dL (ref 0.3–1.2)
Total Protein: 7.4 g/dL (ref 6.5–8.1)

## 2021-06-25 MED ORDER — LIDOCAINE-PRILOCAINE 2.5-2.5 % EX CREA: TOPICAL_CREAM | CUTANEOUS | 3 refills | Status: AC

## 2021-06-25 MED ORDER — DEXAMETHASONE 4 MG PO TABS: 8.0000 mg | ORAL_TABLET | Freq: Every day | ORAL | 1 refills | Status: AC

## 2021-06-25 MED ORDER — PROCHLORPERAZINE MALEATE 10 MG PO TABS: 10.0000 mg | ORAL_TABLET | Freq: Four times a day (QID) | ORAL | 1 refills | Status: AC | PRN

## 2021-06-25 MED ORDER — ONDANSETRON HCL 8 MG PO TABS
8.0000 mg | ORAL_TABLET | Freq: Two times a day (BID) | ORAL | 1 refills | Status: AC | PRN
Start: 2021-06-25 — End: ?

## 2021-06-25 MED ORDER — LORAZEPAM 0.5 MG PO TABS: 0.5000 mg | ORAL_TABLET | Freq: Four times a day (QID) | ORAL | 0 refills | Status: AC | PRN

## 2021-06-25 NOTE — Telephone Encounter (Signed)
Request for [place] [facility] sent on specimen [id number], peripancreatic mass, collected 06/10/21.

## 2021-06-25 NOTE — Progress Notes (Signed)
START OFF PATHWAY REGIMEN - Other   [id number]:Carboplatin AUC=5 IV D1 + Paclitaxel 175 mg/m2 IV D1 q21 Days:   A cycle is every 21 days:     Paclitaxel      Carboplatin   **Always confirm dose/schedule in your pharmacy ordering system**  Patient Characteristics: Intent of Therapy: Curative Intent, Discussed with Patient

## 2021-06-26 LAB — CA 125: Cancer Antigen (CA) 125: 193 U/mL — ABNORMAL HIGH (ref 0.0–38.1)

## 2021-06-27 ENCOUNTER — Encounter: Payer: Self-pay | Admitting: Oncology

## 2021-06-27 NOTE — Progress Notes (Signed)
Hematology/Oncology Consult note [facility] 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 Fransisca Connors   Date of visit: 06/27/21   History of presenting illness- patient is a 55 year old female with history of stage III epithelial ovarian cancer diagnosed in 1995 s/p TRS followed by 6 cycles of CarboTaxol.  She then had a second look surgery at [facility] in October 1995.  She had a recurrence in 1997 and again she took cisplatin and Taxol chemotherapy as an inpatient at that time followed by 1 year of Hexalen.  She has been NED since then and follows up with GYN with regular CA125 levels.  She has had a port placement in the past but that got infected and it was taken out most recently patient was found to have an elevated CA125 Of 132 which prompted a repeat CT chest abdomen and pelvis with contrast.    CT on 05/27/2021 showed a large heterogeneous mass centered in the region of spleen measuring 13 x 11 x 12.7 cm with areas of internal enhancement and probable areas of internal calcification.  This lesion extends medial and anterior to the spleen and is contiguous with adjacent tail of the pancreas.  Lesion exerts mass-effect upon adjacent structures displacing the stomach medially and superiorly.  Loss of intervening fat plane between the lesion in the stomach without definite direct gastric invasion.  Lesion is intimately associated with splenic flexure of the colon although definite colonic invasion not identified.  Upper abdominal adenopathy including hepatoduodenal ligament but lymph node measuring 1.9 cm and markedly enlarged portocaval lymph node measuring 3.2 cm.  Multiple other prominent but nonenlarged  upper retroperitoneal lymph nodes seen.  EUS guided FNA of the peripancreatic mass was positive for malignancy compatible with serous carcinoma.  Adequately cellular sampling comprising of numerous clusters of abnormal appearing cells with nuclear enlargement and significant nuclear contour irregularities and prominent nucleoli.  Patient reports feeling well overall and denies any abdominal pain bloating or changes in her bowel habits.  She she denies any residual neuropathy from her prior chemotherapy which she tolerated well.   ECOG PS- 0  Pain scale- 0   Review of systems- Review of Systems  Constitutional:  Negative for chills, fever, malaise/fatigue and weight loss.  HENT:  Negative for congestion, ear discharge and nosebleeds.   Eyes:  Negative for blurred vision.  Respiratory:  Negative for cough, hemoptysis, sputum production, shortness of breath and wheezing.   Cardiovascular:  Negative for chest pain, palpitations, orthopnea and claudication.  Gastrointestinal:  Negative for abdominal pain, blood in stool, constipation, diarrhea, heartburn, melena, nausea and vomiting.  Genitourinary:  Negative for dysuria, flank pain, frequency, hematuria and urgency.  Musculoskeletal:  Negative for back pain, joint pain and myalgias.  Skin:  Negative for rash.  Neurological:  Negative for dizziness, tingling, focal weakness, seizures, weakness and headaches.  Endo/Heme/Allergies:  Does not bruise/bleed easily.  Psychiatric/Behavioral:  Negative for depression and suicidal ideas. The patient does not have insomnia.    Allergies  Allergen Reactions   Pollen Extract Anaphylaxis   Contrast Media [Iodinated Diagnostic Agents]    Fish Allergy Other (See Comments)   Other Other (See Comments)    Foods, citrus fruits, trees    Patient Active Problem  List   Diagnosis Date Noted   Allergic rhinitis 05/20/2016   Allergy to environmental factors 11/04/2015   BP (high blood pressure) 11/04/2015    Malignant neoplasm of ovary ([place]) 11/04/2015   Ovarian cancer ([place]) 11/04/2015     Past Medical History:  Diagnosis Date   History of colonic polyps    s/p colonoscopy x two   Hypertension    Ovarian cancer ([place]) 1995   stage 3     Past Surgical History:  Procedure Laterality Date   ABDOMINAL HYSTERECTOMY  1995   complete Hyst   EUS N/A 06/10/2021   Procedure: UPPER ENDOSCOPIC ULTRASOUND (EUS) LINEAR;  Surgeon: Reita Cliche, MD;  Location: [facility] ENDOSCOPY;  Service: Gastroenterology;  Laterality: N/A;  [facility]   exploratoy hysteroscopy     1995    Social History   Socioeconomic History   Marital status: Single    Spouse name: Not on file   Number of children: Not on file   Years of education: Not on file   Highest education level: Not on file  Occupational History   Not on file  Tobacco Use   Smoking status: Never   Smokeless tobacco: Never  Vaping Use   Vaping Use: Never used  Substance and Sexual Activity   Alcohol use: No    Alcohol/week: 0.0 standard drinks   Drug use: No   Sexual activity: Never  Other Topics Concern   Not on file  Social History Narrative   Not on file   Social Determinants of Health   Financial Resource Strain: Not on file  Food Insecurity: Not on file  Transportation Needs: Not on file  Physical Activity: Not on file  Stress: Not on file  Social Connections: Not on file  Intimate Partner Violence: Not on file     Family History  Problem Relation Age of Onset   Cancer Maternal Grandmother        stomach cancer   Cancer Paternal Uncle        prostate cancer - 2 uncles > 71 y.o   Colon cancer Paternal Uncle        wtih mets   Breast cancer Neg Hx      Current Outpatient Medications:    Calcium Carb-Cholecalciferol (OYSTER SHELL CALCIUM) 500-400 MG-UNIT TABS, Take by mouth., Disp: , Rfl:    calcium-vitamin D (OSCAL-500) 500-400 MG-UNIT tablet, 1 tablet daily. , Disp: , Rfl:    loratadine (CLARITIN) 10 MG tablet,  Take 10 mg by mouth daily as needed for allergies., Disp: , Rfl:    losartan-hydrochlorothiazide (HYZAAR) 50-12.5 MG tablet, Take 1 tablet by mouth daily., Disp: , Rfl:    montelukast (SINGULAIR) 10 MG tablet, Take 10 mg by mouth daily. , Disp: , Rfl:    Multiple Vitamin (MULTIVITAMIN) capsule, Take 1 capsule by mouth daily. , Disp: , Rfl:    Triamcinolone Acetonide (NASACORT AQ NA), Place 1 spray into the nose daily., Disp: , Rfl:    Wheat Dextrin (BENEFIBER) POWD, Take by mouth., Disp: , Rfl:    Azelastine HCl 0.15 % SOLN, INHALE 2 SPRAYS IN EACH NOSTRIL TWICE A DAY A NEEDED (Patient not taking: Reported on 06/25/2021), Disp: , Rfl: 7   dexamethasone (DECADRON) 4 MG tablet, Take 2 tablets (8 mg total) by mouth daily. Start the day after carboplatin chemotherapy for 3 days., Disp: 30 tablet, Rfl: 1   diphenhydrAMINE (BENADRYL) 50 MG capsule, Take one capsule 1 hour prior to scan. (Patient not taking:  Reported on 06/25/2021), Disp: 1 capsule, Rfl: 0   lidocaine-prilocaine (EMLA) cream, Apply to affected area once, Disp: 30 g, Rfl: 3   LORazepam (ATIVAN) 0.5 MG tablet, Take 1 tablet (0.5 mg total) by mouth every 6 (six) hours as needed (Nausea or vomiting)., Disp: 30 tablet, Rfl: 0   ondansetron (ZOFRAN) 8 MG tablet, Take 1 tablet (8 mg total) by mouth 2 (two) times daily as needed for refractory nausea / vomiting. Start on day 3 after carboplatin chemo., Disp: 30 tablet, Rfl: 1   predniSONE (DELTASONE) 50 MG tablet, Take one tablet 13 hours, 7 hours, and 1 hour prior to scan. (Patient not taking: Reported on 06/25/2021), Disp: 3 tablet, Rfl: 0   prochlorperazine (COMPAZINE) 10 MG tablet, Take 1 tablet (10 mg total) by mouth every 6 (six) hours as needed (Nausea or vomiting)., Disp: 30 tablet, Rfl: 1   Physical exam:  Vitals:   06/25/21 1050  BP: (!) 137/98  Pulse: (!) 109  Resp: 18  Temp: 98.5 F (36.9 C)  SpO2: 100%  Weight: 176 lb 6.4 oz (80 kg)  Height: 5\' 2"  (1.575 m)   Physical  Exam Constitutional:      General: She is not in acute distress. Cardiovascular:     Rate and Rhythm: Normal rate and regular rhythm.     Heart sounds: Normal heart sounds.  Pulmonary:     Effort: Pulmonary effort is normal.     Breath sounds: Normal breath sounds.  Abdominal:     General: Bowel sounds are normal.     Palpations: Abdomen is soft.  Skin:    General: Skin is warm and dry.  Neurological:     Mental Status: She is alert and oriented to person, place, and time.       CMP Latest Ref Rng & Units 06/25/2021  Glucose 70 - 99 mg/dL 110(H)  BUN 6 - 20 mg/dL 11  Creatinine 0.44 - 1.00 mg/dL 0.55  Sodium 135 - 145 mmol/L 138  Potassium 3.5 - 5.1 mmol/L 3.6  Chloride 98 - 111 mmol/L 98  CO2 22 - 32 mmol/L 31  Calcium 8.9 - 10.3 mg/dL 9.6  Total Protein 6.5 - 8.1 g/dL 7.4  Total Bilirubin 0.3 - 1.2 mg/dL 0.6  Alkaline Phos 38 - 126 U/L 78  AST 15 - 41 U/L 27  ALT 0 - 44 U/L 24   CBC Latest Ref Rng & Units 06/25/2021  WBC 4.0 - 10.5 K/uL 10.1  Hemoglobin 12.0 - 15.0 g/dL 14.1  Hematocrit 36.0 - 46.0 % 43.1  Platelets 150 - 400 K/uL 232        Assessment and plan- Patient is a 55 y.o. 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  Dr. Fransisca Connors will be discussing patient's case with pancreaticobiliary surgery Dr. Sol Blazing to see if she would be a candidate for resection down the line.  Plan for now is to proceed with neoadjuvant chemotherapy and I will offer her 3 cycles of CarboTaxol.  Carboplatin given AUC 5 along with Taxol at 175 mg per metered squared IV every 3 weeks.  We will plan to do this without a port placement through  peripheral  IV.  Discussed risks and benefits of chemotherapy including all but not limited to nausea, vomiting, low blood counts, risk of infections and hospitalizations.  Risk of peripheral neuropathy associated with both carboplatin and Taxol.  We will tentatively plan to start chemotherapy next week.  I will check baseline labs today including CBC with differential CMP and CA125.  She will directly proceed for chemotherapy next week and I will see her back in 4 weeks for cycle 2   Thank you for this kind referral and the opportunity to participate in the care of this patient   Visit Diagnosis 1. Malignant neoplasm of right ovary (HCC)     Dr. Randa Evens, MD, MPH [facility] at [facility] [phone] 06/27/2021

## 2021-06-28 ENCOUNTER — Encounter: Payer: Self-pay | Admitting: Oncology

## 2021-06-28 NOTE — Addendum Note (Signed)
Addended by: Randa Evens C on: 06/28/2021 12:05 PM   Modules accepted: Orders

## 2021-06-29 ENCOUNTER — Other Ambulatory Visit: Payer: Managed Care, Other (non HMO)

## 2021-06-29 ENCOUNTER — Ambulatory Visit: Payer: Managed Care, Other (non HMO) | Admitting: Oncology

## 2021-07-01 ENCOUNTER — Other Ambulatory Visit: Payer: Self-pay

## 2021-07-01 ENCOUNTER — Inpatient Hospital Stay: Payer: Managed Care, Other (non HMO)

## 2021-07-01 VITALS — BP 124/72 | HR 89 | Temp 97.1°F | Resp 18 | Wt 176.0 lb

## 2021-07-01 DIAGNOSIS — C561 Malignant neoplasm of right ovary: Secondary | ICD-10-CM

## 2021-07-01 MED ORDER — SODIUM CHLORIDE 0.9 % IV SOLN
150.0000 mg | Freq: Once | INTRAVENOUS | Status: AC
  Administered 2021-07-01: 10:00:00 150 mg via INTRAVENOUS
  Filled 2021-07-01: qty 150

## 2021-07-01 MED ORDER — FAMOTIDINE 20 MG IN NS 100 ML IVPB
20.0000 mg | Freq: Once | INTRAVENOUS | Status: AC
  Administered 2021-07-01: 20 mg via INTRAVENOUS
  Filled 2021-07-01: qty 20

## 2021-07-01 MED ORDER — SODIUM CHLORIDE 0.9 % IV SOLN
Freq: Once | INTRAVENOUS | Status: AC
  Filled 2021-07-01: qty 250

## 2021-07-01 MED ORDER — SODIUM CHLORIDE 0.9 % IV SOLN
638.5000 mg | Freq: Once | INTRAVENOUS | Status: AC
  Administered 2021-07-01: 14:00:00 640 mg via INTRAVENOUS
  Filled 2021-07-01: qty 64

## 2021-07-01 MED ORDER — SODIUM CHLORIDE 0.9 % IV SOLN
10.0000 mg | Freq: Once | INTRAVENOUS | Status: AC
  Administered 2021-07-01: 10:00:00 10 mg via INTRAVENOUS
  Filled 2021-07-01: qty 10

## 2021-07-01 MED ORDER — PALONOSETRON HCL INJECTION 0.25 MG/5ML
0.2500 mg | Freq: Once | INTRAVENOUS | Status: AC
  Administered 2021-07-01: 09:00:00 0.25 mg via INTRAVENOUS
  Filled 2021-07-01: qty 5

## 2021-07-01 MED ORDER — DIPHENHYDRAMINE HCL 50 MG/ML IJ SOLN
50.0000 mg | Freq: Once | INTRAMUSCULAR | Status: AC
  Administered 2021-07-01: 09:00:00 50 mg via INTRAVENOUS
  Filled 2021-07-01: qty 1

## 2021-07-01 MED ORDER — SODIUM CHLORIDE 0.9 % IV SOLN
175.0000 mg/m2 | Freq: Once | INTRAVENOUS | Status: AC
  Administered 2021-07-01: 330 mg via INTRAVENOUS
  Filled 2021-07-01: qty 55

## 2021-07-01 NOTE — Patient Instructions (Signed)
[facility]  Discharge Instructions: Thank you for choosing [facility] to provide your oncology and hematology care.  If you have a lab appointment with the [facility], please go directly to the [facility] and check in at the registration area.  Wear comfortable clothing and clothing appropriate for easy access to any Portacath or PICC line.   We strive to give you quality time with your provider. You may need to reschedule your appointment if you arrive late (15 or more minutes).  Arriving late affects you and other patients whose appointments are after yours.  Also, if you miss three or more appointments without notifying the office, you may be dismissed from the clinic at the provider's discretion.      For prescription refill requests, have your pharmacy contact our office and allow 72 hours for refills to be completed.    Today you received the following chemotherapy and/or immunotherapy agents : Taxol / Carboplatin   Paclitaxel injection What is this medication? PACLITAXEL (PAK li TAX el) is a chemotherapy drug. It targets fast dividing cells, like cancer cells, and causes these cells to die. This medicine is used to treat ovarian cancer, breast cancer, lung cancer, Kaposi's sarcoma, and other cancers. This medicine may be used for other purposes; ask your health care provider or pharmacist if you have questions. COMMON BRAND NAME(S): Onxol, Taxol What should I tell my care team before I take this medication? They need to know if you have any of these conditions: history of irregular heartbeat liver disease low blood counts, like low white cell, platelet, or red cell counts lung or breathing disease, like asthma tingling of the fingers or toes, or other nerve disorder an unusual or allergic reaction to paclitaxel, alcohol, polyoxyethylated castor oil, other chemotherapy, other medicines, foods, dyes, or preservatives pregnant  or trying to get pregnant breast-feeding How should I use this medication? This drug is given as an infusion into a vein. It is administered in a hospital or clinic by a specially trained health care professional. Talk to your pediatrician regarding the use of this medicine in children. Special care may be needed. Overdosage: If you think you have taken too much of this medicine contact a poison control center or emergency room at once. NOTE: This medicine is only for you. Do not share this medicine with others. What if I miss a dose? It is important not to miss your dose. Call your doctor or health care professional if you are unable to keep an appointment. What may interact with this medication? Do not take this medicine with any of the following medications: live virus vaccines This medicine may also interact with the following medications: antiviral medicines for hepatitis, HIV or AIDS certain antibiotics like erythromycin and clarithromycin certain medicines for fungal infections like ketoconazole and itraconazole certain medicines for seizures like carbamazepine, phenobarbital, phenytoin gemfibrozil nefazodone rifampin St. John's wort This list may not describe all possible interactions. Give your health care provider a list of all the medicines, herbs, non-prescription drugs, or dietary supplements you use. Also tell them if you smoke, drink alcohol, or use illegal drugs. Some items may interact with your medicine. What should I watch for while using this medication? Your condition will be monitored carefully while you are receiving this medicine. You will need important blood work done while you are taking this medicine. This medicine can cause serious allergic reactions. To reduce your risk you will need to take other  medicine(s) before treatment with this medicine. If you experience allergic reactions like skin rash, itching or hives, swelling of the face, lips, or tongue, tell your  doctor or health care professional right away. In some cases, you may be given additional medicines to help with side effects. Follow all directions for their use. This drug may make you feel generally unwell. This is not uncommon, as chemotherapy can affect healthy cells as well as cancer cells. Report any side effects. Continue your course of treatment even though you feel ill unless your doctor tells you to stop. Call your doctor or health care professional for advice if you get a fever, chills or sore throat, or other symptoms of a cold or flu. Do not treat yourself. This drug decreases your body's ability to fight infections. Try to avoid being around people who are sick. This medicine may increase your risk to bruise or bleed. Call your doctor or health care professional if you notice any unusual bleeding. Be careful brushing and flossing your teeth or using a toothpick because you may get an infection or bleed more easily. If you have any dental work done, tell your dentist you are receiving this medicine. Avoid taking products that contain aspirin, acetaminophen, ibuprofen, naproxen, or ketoprofen unless instructed by your doctor. These medicines may hide a fever. Do not become pregnant while taking this medicine. Women should inform their doctor if they wish to become pregnant or think they might be pregnant. There is a potential for serious side effects to an unborn child. Talk to your health care professional or pharmacist for more information. Do not breast-feed an infant while taking this medicine. Men are advised not to father a child while receiving this medicine. This product may contain alcohol. Ask your pharmacist or healthcare provider if this medicine contains alcohol. Be sure to tell all healthcare providers you are taking this medicine. Certain medicines, like metronidazole and disulfiram, can cause an unpleasant reaction when taken with alcohol. The reaction includes flushing,  headache, nausea, vomiting, sweating, and increased thirst. The reaction can last from 30 minutes to several hours. What side effects may I notice from receiving this medication? Side effects that you should report to your doctor or health care professional as soon as possible: allergic reactions like skin rash, itching or hives, swelling of the face, lips, or tongue breathing problems changes in vision fast, irregular heartbeat high or low blood pressure mouth sores pain, tingling, numbness in the hands or feet signs of decreased platelets or bleeding - bruising, pinpoint red spots on the skin, black, tarry stools, blood in the urine signs of decreased red blood cells - unusually weak or tired, feeling faint or lightheaded, falls signs of infection - fever or chills, cough, sore throat, pain or difficulty passing urine signs and symptoms of liver injury like dark yellow or brown urine; general ill feeling or flu-like symptoms; light-colored stools; loss of appetite; nausea; right upper belly pain; unusually weak or tired; yellowing of the eyes or skin swelling of the ankles, feet, hands unusually slow heartbeat Side effects that usually do not require medical attention (report to your doctor or health care professional if they continue or are bothersome): diarrhea hair loss loss of appetite muscle or joint pain nausea, vomiting pain, redness, or irritation at site where injected tiredness This list may not describe all possible side effects. Call your doctor for medical advice about side effects. You may report side effects to FDA at 1-800-FDA-1088. Where should I keep  my medication? This drug is given in a hospital or clinic and will not be stored at home. NOTE: This sheet is a summary. It may not cover all possible information. If you have questions about this medicine, talk to your doctor, pharmacist, or health care provider.  2022 Elsevier/Gold Standard (2021-04-20 00:00:00)     To help prevent nausea and vomiting after your treatment, we encourage you to take your nausea medication as directed.  BELOW ARE SYMPTOMS THAT SHOULD BE REPORTED IMMEDIATELY: *FEVER GREATER THAN 100.4 F (38 C) OR HIGHER *CHILLS OR SWEATING *NAUSEA AND VOMITING THAT IS NOT CONTROLLED WITH YOUR NAUSEA MEDICATION *UNUSUAL SHORTNESS OF BREATH *UNUSUAL BRUISING OR BLEEDING *URINARY PROBLEMS (pain or burning when urinating, or frequent urination) *BOWEL PROBLEMS (unusual diarrhea, constipation, pain near the anus) TENDERNESS IN MOUTH AND THROAT WITH OR WITHOUT PRESENCE OF ULCERS (sore throat, sores in mouth, or a toothache) UNUSUAL RASH, SWELLING OR PAIN  UNUSUAL VAGINAL DISCHARGE OR ITCHING   Items with * indicate a potential emergency and should be followed up as soon as possible or go to the Emergency Department if any problems should occur.     Carboplatin injection What is this medication? CARBOPLATIN (KAR boe pla tin) is a chemotherapy drug. It targets fast dividing cells, like cancer cells, and causes these cells to die. This medicine is used to treat ovarian cancer and many other cancers. This medicine may be used for other purposes; ask your health care provider or pharmacist if you have questions. COMMON BRAND NAME(S): Paraplatin What should I tell my care team before I take this medication? They need to know if you have any of these conditions: blood disorders hearing problems kidney disease recent or ongoing radiation therapy an unusual or allergic reaction to carboplatin, cisplatin, other chemotherapy, other medicines, foods, dyes, or preservatives pregnant or trying to get pregnant breast-feeding How should I use this medication? This drug is usually given as an infusion into a vein. It is administered in a hospital or clinic by a specially trained health care professional. Talk to your pediatrician regarding the use of this medicine in children. Special care may be  needed. Overdosage: If you think you have taken too much of this medicine contact a poison control center or emergency room at once. NOTE: This medicine is only for you. Do not share this medicine with others. What if I miss a dose? It is important not to miss a dose. Call your doctor or health care professional if you are unable to keep an appointment. What may interact with this medication? medicines for seizures medicines to increase blood counts like filgrastim, pegfilgrastim, sargramostim some antibiotics like amikacin, gentamicin, neomycin, streptomycin, tobramycin vaccines Talk to your doctor or health care professional before taking any of these medicines: acetaminophen aspirin ibuprofen ketoprofen naproxen This list may not describe all possible interactions. Give your health care provider a list of all the medicines, herbs, non-prescription drugs, or dietary supplements you use. Also tell them if you smoke, drink alcohol, or use illegal drugs. Some items may interact with your medicine. What should I watch for while using this medication? Your condition will be monitored carefully while you are receiving this medicine. You will need important blood work done while you are taking this medicine. This drug may make you feel generally unwell. This is not uncommon, as chemotherapy can affect healthy cells as well as cancer cells. Report any side effects. Continue your course of treatment even though you feel ill unless your doctor  tells you to stop. In some cases, you may be given additional medicines to help with side effects. Follow all directions for their use. Call your doctor or health care professional for advice if you get a fever, chills or sore throat, or other symptoms of a cold or flu. Do not treat yourself. This drug decreases your body's ability to fight infections. Try to avoid being around people who are sick. This medicine may increase your risk to bruise or bleed. Call  your doctor or health care professional if you notice any unusual bleeding. Be careful brushing and flossing your teeth or using a toothpick because you may get an infection or bleed more easily. If you have any dental work done, tell your dentist you are receiving this medicine. Avoid taking products that contain aspirin, acetaminophen, ibuprofen, naproxen, or ketoprofen unless instructed by your doctor. These medicines may hide a fever. Do not become pregnant while taking this medicine. Women should inform their doctor if they wish to become pregnant or think they might be pregnant. There is a potential for serious side effects to an unborn child. Talk to your health care professional or pharmacist for more information. Do not breast-feed an infant while taking this medicine. What side effects may I notice from receiving this medication? Side effects that you should report to your doctor or health care professional as soon as possible: allergic reactions like skin rash, itching or hives, swelling of the face, lips, or tongue signs of infection - fever or chills, cough, sore throat, pain or difficulty passing urine signs of decreased platelets or bleeding - bruising, pinpoint red spots on the skin, black, tarry stools, nosebleeds signs of decreased red blood cells - unusually weak or tired, fainting spells, lightheadedness breathing problems changes in hearing changes in vision chest pain high blood pressure low blood counts - This drug may decrease the number of white blood cells, red blood cells and platelets. You may be at increased risk for infections and bleeding. nausea and vomiting pain, swelling, redness or irritation at the injection site pain, tingling, numbness in the hands or feet problems with balance, talking, walking trouble passing urine or change in the amount of urine Side effects that usually do not require medical attention (report to your doctor or health care professional  if they continue or are bothersome): hair loss loss of appetite metallic taste in the mouth or changes in taste This list may not describe all possible side effects. Call your doctor for medical advice about side effects. You may report side effects to FDA at 1-800-FDA-1088. Where should I keep my medication? This drug is given in a hospital or clinic and will not be stored at home. NOTE: This sheet is a summary. It may not cover all possible information. If you have questions about this medicine, talk to your doctor, pharmacist, or health care provider.  2022 Elsevier/Gold Standard (2008-01-09 00:00:00)   Please show the CHEMOTHERAPY ALERT CARD or IMMUNOTHERAPY ALERT CARD at check-in to the Emergency Department and triage nurse.  Should you have questions after your visit or need to cancel or reschedule your appointment, please contact [facility]  [phone] and follow the prompts.  Office hours are 8:00 a.m. to 4:30 p.m. Monday - Friday. Please note that voicemails left after 4:00 p.m. may not be returned until the following business day.  We are closed weekends and major holidays. You have access to a nurse at all times for urgent questions. Please  call the main number to the clinic [phone] and follow the prompts.  For any non-urgent questions, you may also contact your provider using MyChart. We now offer e-Visits for anyone 52 and older to request care online for non-urgent symptoms. For details visit [email or web address].   Also download the MyChart app! Go to the app store, search "MyChart", open the app, select [facility], and log in with your MyChart username and password.  Due to Covid, a mask is required upon entering the hospital/clinic. If you do not have a mask, one will be given to you upon arrival. For doctor visits, patients may have 1 support person aged 73 or older with them. For treatment visits, patients cannot have anyone with  them due to current Covid guidelines and our immunocompromised population.

## 2021-07-12 ENCOUNTER — Telehealth: Payer: Self-pay | Admitting: *Deleted

## 2021-07-12 NOTE — Telephone Encounter (Signed)
Nira Conn, RN Received fmla from Mariea Clonts, Therapist, sports. Pt requesting intermittent fmla. Pt would like fmla to start on her First treatment date of 07/01/21.  Mychart msg sent to patient to acknowledge receipt of fmla forms.

## 2021-07-13 ENCOUNTER — Encounter: Payer: Self-pay | Admitting: Oncology

## 2021-07-13 ENCOUNTER — Telehealth: Payer: Self-pay

## 2021-07-13 NOTE — Telephone Encounter (Signed)
I believe she has a lab visit today and she can hold off on coming for that today.  She will keep her appointment with me on 07/21/2021 as scheduled.  She will call us if she has any worsening symptoms

## 2021-07-13 NOTE — Telephone Encounter (Signed)
Received call from [facility]. F[place]. She starting having congestion, and scratchy throat since Thanksgiving Day, Thursday. She was febrile for one day, 07/12/21. She went to physician and was tested for COVID, flu and strep. She was positive for influenza A. No fever today. She was given Tamiflu  and Prednisone. Please let me know if she needs to do anything further.

## 2021-07-13 NOTE — Telephone Encounter (Signed)
Fmla papers signed by Dr. Janese Banks and faxed to pt's HR dept at [phone] per pt's request.

## 2021-07-13 NOTE — Telephone Encounter (Signed)
Notified Trease of above

## 2021-07-14 NOTE — Anesthesia Postprocedure Evaluation (Signed)
Anesthesia Post Note  Patient: Latoya Lewis  Procedure(s) Performed: UPPER ENDOSCOPIC ULTRASOUND (EUS) LINEAR  Patient location during evaluation: PACU Anesthesia Type: General Level of consciousness: awake Pain management: pain level controlled Vital Signs Assessment: post-procedure vital signs reviewed and stable Respiratory status: spontaneous breathing Cardiovascular status: blood pressure returned to baseline Anesthetic complications: no   No notable events documented.   Last Vitals:  Vitals:   06/10/21 1224  BP: (!) 130/92  Pulse: 91  Resp: 18  Temp: 36.8 C  SpO2: 100%    Last Pain:  Vitals:   06/11/21 0731  TempSrc:   PainSc: 0-No pain                 VAN STAVEREN,Flonnie Wierman

## 2021-07-19 ENCOUNTER — Telehealth: Payer: Self-pay | Admitting: *Deleted

## 2021-07-19 NOTE — Telephone Encounter (Signed)
I have called [facility] and asked them to refax anything needed as it has not been received to the chart.

## 2021-07-19 NOTE — Telephone Encounter (Signed)
[facility] called stating that they faxed over a request for a letter of medical necessity to our office and is asking for a return call to let them know if it was received and if we are working on it. Please return her call

## 2021-07-20 ENCOUNTER — Telehealth: Payer: Self-pay | Admitting: Oncology

## 2021-07-20 NOTE — Telephone Encounter (Signed)
Called pt and she states that she had her last treatment 11/17 and then she got the flu. She has not recuperated from this. Still having some flu like sx and very tired and worn down. She feels that she should feel better before she gets another treatment. We will r/s her to next week. Pt. Will call on Monday of next  week to see if she is feeling ok to get treatment on next wed.

## 2021-07-20 NOTE — Telephone Encounter (Signed)
Pt called to cancel her appt for 12-7. She is sick. Please give her a call to reschedule at [phone]

## 2021-07-21 ENCOUNTER — Inpatient Hospital Stay: Payer: Managed Care, Other (non HMO) | Admitting: Oncology

## 2021-07-21 ENCOUNTER — Inpatient Hospital Stay: Payer: Managed Care, Other (non HMO)

## 2021-07-23 ENCOUNTER — Telehealth: Payer: Self-pay | Admitting: Oncology

## 2021-07-23 NOTE — Telephone Encounter (Signed)
Maudie Mercury- case manager for [employer] [phone] ext [phone] Give her a call if pt needs anything

## 2021-07-27 ENCOUNTER — Encounter: Payer: Self-pay | Admitting: Nurse Practitioner

## 2021-07-28 ENCOUNTER — Inpatient Hospital Stay: Payer: Managed Care, Other (non HMO)

## 2021-07-28 ENCOUNTER — Inpatient Hospital Stay: Payer: Managed Care, Other (non HMO) | Admitting: Oncology

## 2021-08-12 ENCOUNTER — Other Ambulatory Visit: Payer: Managed Care, Other (non HMO)

## 2021-08-15 NOTE — Progress Notes (Signed)
Received notification from [facility] that patient had passed away at her home. Family had not heard from patient. Requested a welfare check. PD forcibly entered home and found deceased. Dr. Janese Banks notified.

## 2021-08-15 DEATH — deceased

## 2022-02-16 IMAGING — MG MM DIGITAL SCREENING BILAT W/ TOMO AND CAD
8 series · 8 of 24 positions shown · non-contrast
Comparison: Previous exam(s).

CLINICAL DATA: Screening.

EXAM:
DIGITAL SCREENING BILATERAL MAMMOGRAM WITH TOMOSYNTHESIS AND CAD
TECHNIQUE: Bilateral screening digital craniocaudal and mediolateral oblique
mammograms were obtained. Bilateral screening digital breast
tomosynthesis was performed. The images were evaluated with
computer-aided detection.

[R MLO synth-2D]
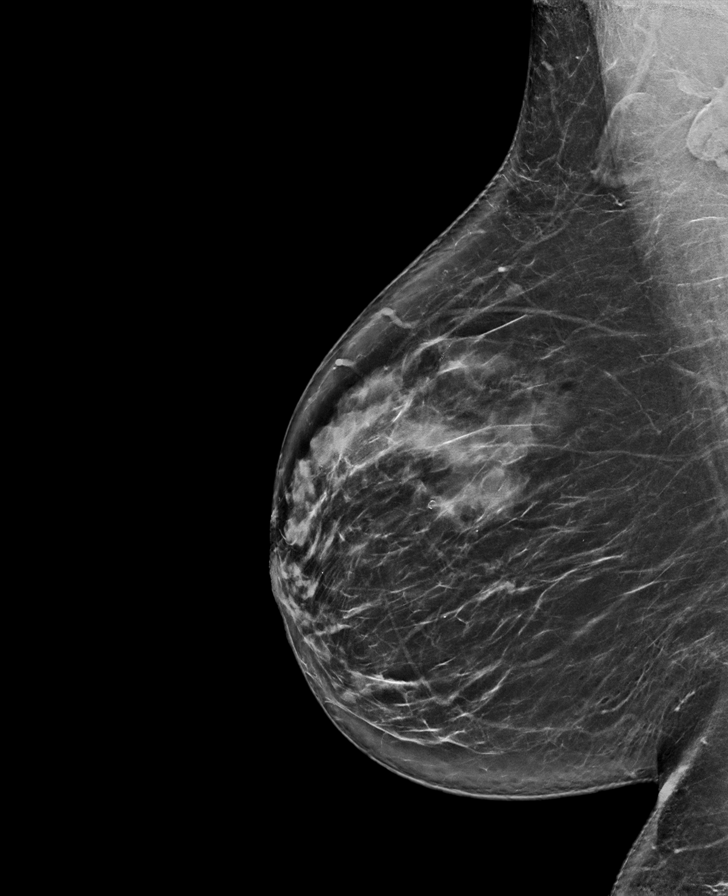

[L MLO synth-2D]
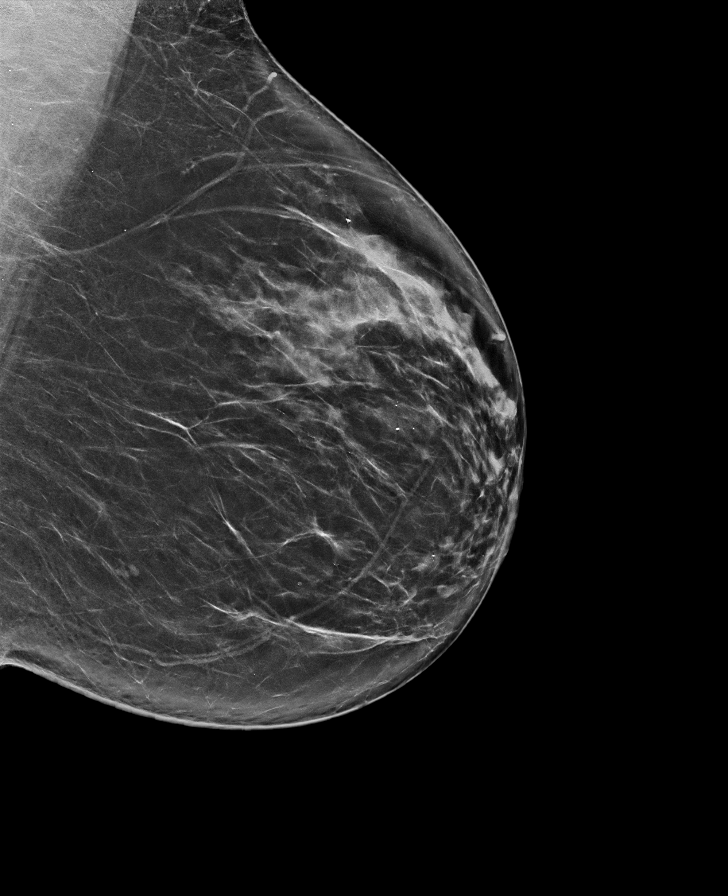

[L CC synth-2D]
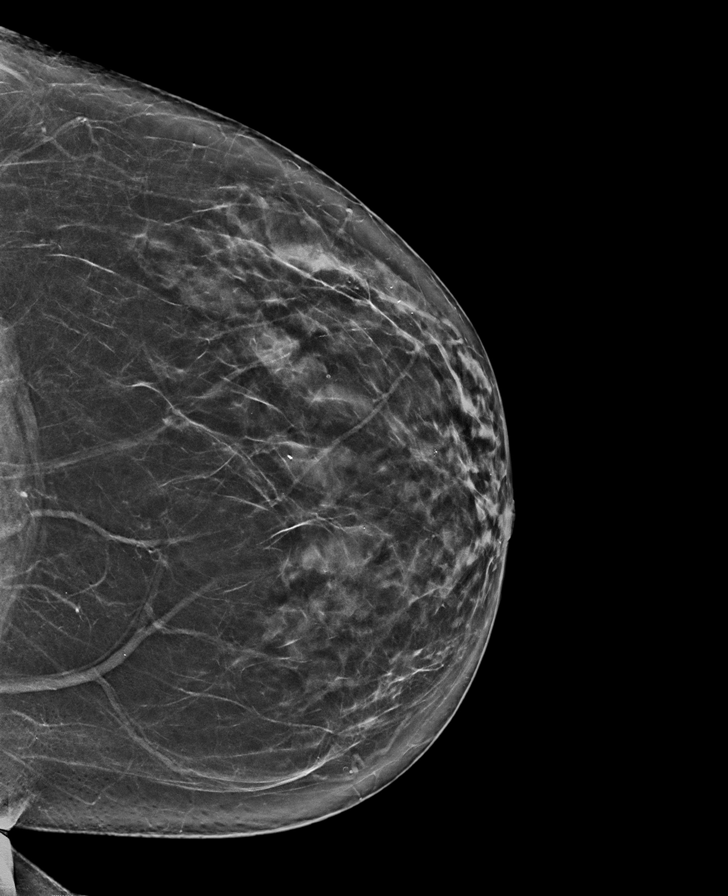

[R CC synth-2D]
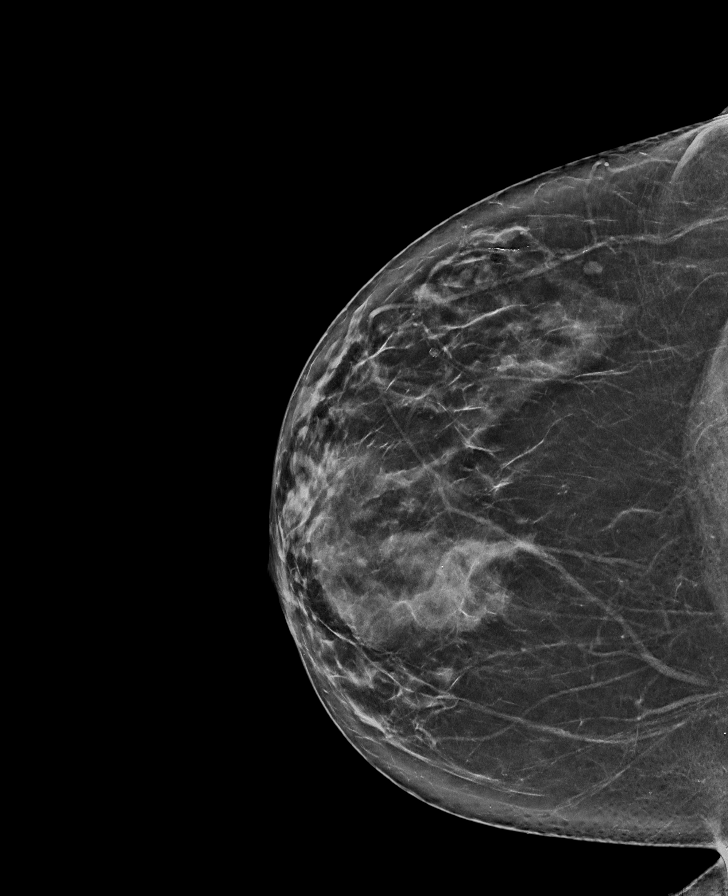

[R CC tomo · tomo slice 37/74.0]
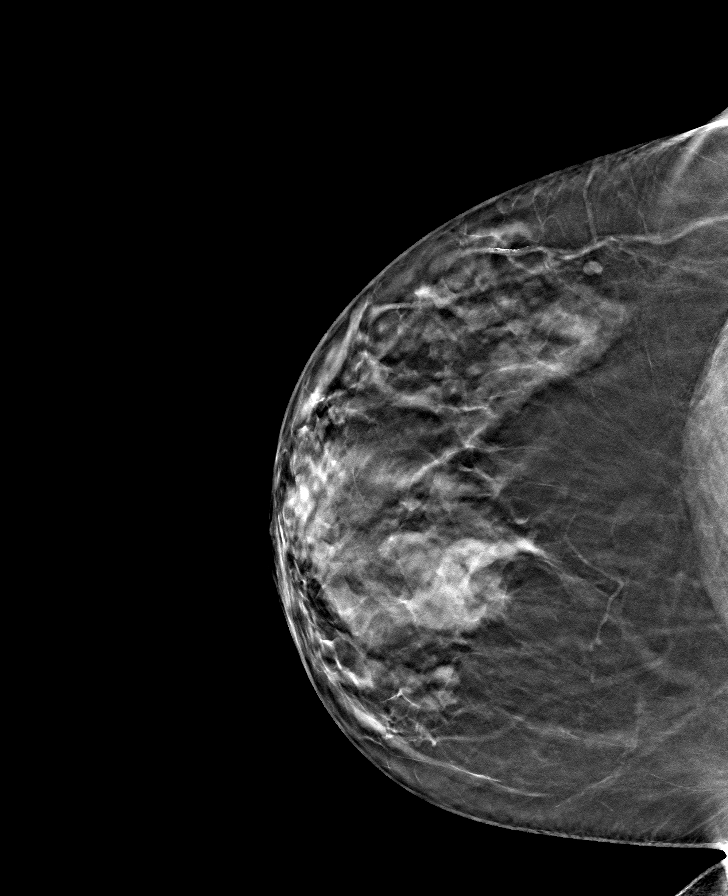

[L MLO tomo · tomo slice 42/83.0]
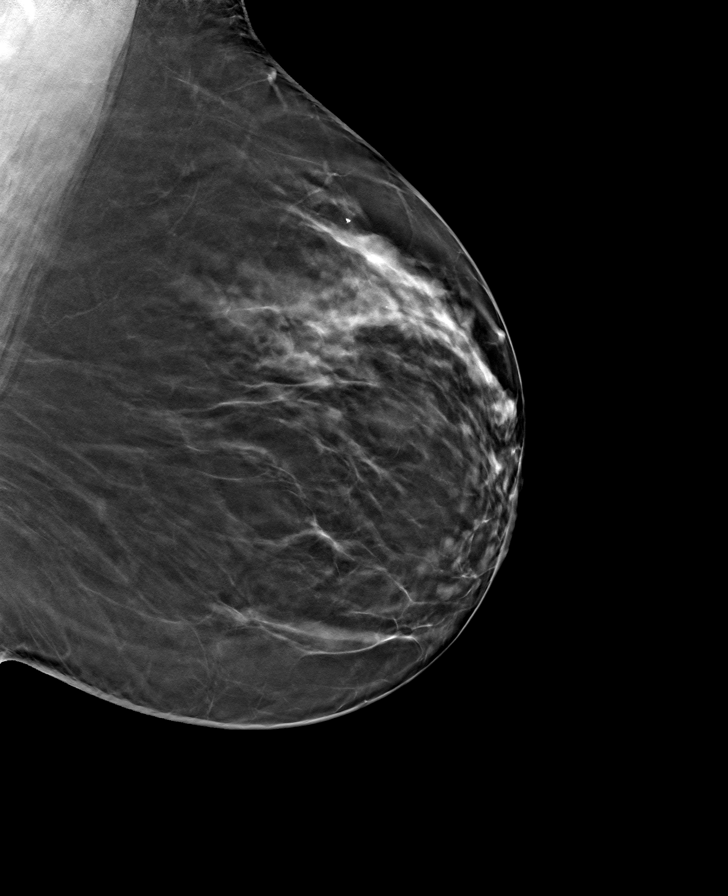

[R MLO tomo · tomo slice 43/84.0]
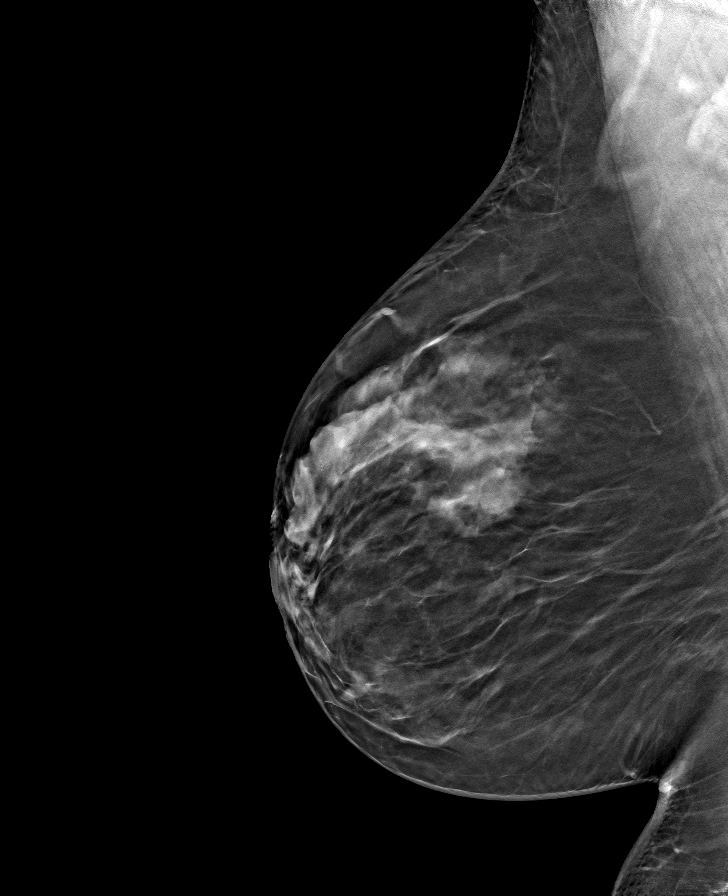

[L CC tomo · tomo slice 37/74.0]
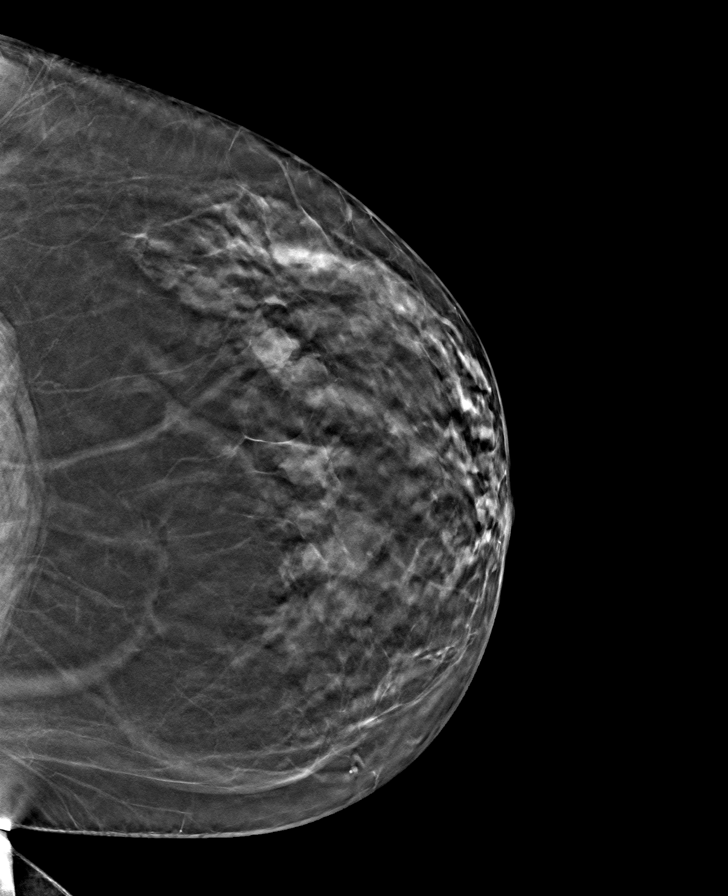

[8 of 24 positions shown; findings below may reference images not displayed]

ACR Breast Density Category c: The breast tissue is heterogeneously
dense, which may obscure small masses.
FINDINGS: There are no findings suspicious for malignancy.
IMPRESSION: No mammographic evidence of malignancy. A result letter of this
screening mammogram will be mailed directly to the patient.

RECOMMENDATION:
Screening mammogram in one year. (Code:Q3-W-BC3)

BI-RADS CATEGORY  1: Negative.

## 2022-05-18 ENCOUNTER — Ambulatory Visit: Payer: Managed Care, Other (non HMO)
